# Patient Record
Sex: Female | Born: 1955 | Race: White | Hispanic: No | Marital: Single | Smoking: Never smoker
Health system: Southern US, Community
[De-identification: ages and names within clinical notes are randomized; demographics above are authoritative.]

## PROBLEM LIST (undated history)

## (undated) HISTORY — PX: BACK SURGERY: SHX140

---

## 1975-07-22 DIAGNOSIS — M419 Scoliosis, unspecified: Secondary | ICD-10-CM

## 1975-07-22 HISTORY — DX: Scoliosis, unspecified: M41.9

## 2014-05-30 ENCOUNTER — Other Ambulatory Visit: Payer: Self-pay | Admitting: Occupational Medicine

## 2014-05-30 ENCOUNTER — Ambulatory Visit: Payer: Self-pay

## 2014-05-30 DIAGNOSIS — R7612 Nonspecific reaction to cell mediated immunity measurement of gamma interferon antigen response without active tuberculosis: Secondary | ICD-10-CM

## 2018-03-19 ENCOUNTER — Encounter: Payer: Self-pay | Admitting: *Deleted

## 2018-03-19 ENCOUNTER — Emergency Department
Admission: EM | Admit: 2018-03-19 | Discharge: 2018-03-19 | Disposition: A | Discharge status: Home / Self Care | Payer: Self-pay | Source: Home / Self Care

## 2018-03-19 ENCOUNTER — Other Ambulatory Visit: Payer: Self-pay

## 2018-03-19 DIAGNOSIS — L02519 Cutaneous abscess of unspecified hand: Secondary | ICD-10-CM

## 2018-03-19 DIAGNOSIS — W5501XA Bitten by cat, initial encounter: Secondary | ICD-10-CM

## 2018-03-19 DIAGNOSIS — L03119 Cellulitis of unspecified part of limb: Secondary | ICD-10-CM

## 2018-03-19 MED ORDER — AMOXICILLIN-POT CLAVULANATE 875-125 MG PO TABS: 1.0000 | ORAL_TABLET | Freq: Two times a day (BID) | ORAL | 0 refills | Status: DC

## 2018-03-19 NOTE — Discharge Instructions (Addendum)
Continue the antibiotics for full 10 days.  Amoxicillin/clavulanate 875 mg twice daily.  If getting at all worse get rechecked.

## 2018-03-19 NOTE — ED Triage Notes (Signed)
Patient reports her cat bit her on her left hand while she was moving and attempting to put her in a crate. [facility] and given Augmentin. She reports it is improving but wants another opinion.

## 2018-03-19 NOTE — ED Provider Notes (Signed)
[facility]    CSN: [id number] Arrival date & time: 03/19/18  1241     History   Chief Complaint Chief Complaint  Patient presents with  . Animal Bite    HPI Alexa Jackson is a 62 y.o. female.   HPI 6 days ago the patient was moving and tried to put her cat in a carrier.  It bit her 3 places, one on the right index finger which is almost healed up, and to place on the left hand.  One is on the dorsum of the hand and one on the back of the proximal digit of the fourth finger.  2 days ago she went to a another urgent care they gave her a prescription for Augmentin.  The hand was swollen and red but no a sending streaks.  She says that the swelling and redness has improved.  Feels like it is definitely better than yesterday.  She just came in for another opinion. History reviewed. No pertinent past medical history.  There are no active problems to display for this patient.   Past Surgical History:  Procedure Laterality Date  . BACK SURGERY      OB History   None      Home Medications    Prior to Admission medications   Medication Sig Start Date End Date Taking? Authorizing Provider  amoxicillin-clavulanate (AUGMENTIN) 875-125 MG tablet Take 1 tablet by mouth 2 (two) times daily.   Yes [provider]  amoxicillin-clavulanate (AUGMENTIN) 875-125 MG tablet Take 1 tablet by mouth every 12 (twelve) hours. 03/19/18   Peyton NajjarHopper, Quinnetta Roepke H, MD    Family History History reviewed. No pertinent family history.  Social History Social History   Tobacco Use  . Smoking status: Never Smoker  . Smokeless tobacco: Never Used  Substance Use Topics  . Alcohol use: Not on file  . Drug use: Not on file     Allergies   Patient has no known allergies.   Review of Systems Review of Systems Otherwise unremarkable  Physical Exam Triage Vital Signs ED Triage Vitals [03/19/18 1306]  Enc Vitals Group     BP (!) 171/84     Pulse Rate 80     Resp 14     Temp  98.6 F (37 C)     Temp Source Oral     SpO2 99 %     Weight 173 lb (78.5 kg)     Height 5\' 3"  (1.6 m)     Head Circumference      Peak Flow      Pain Score 2     Pain Loc      Pain Edu?      Excl. in GC?    No data found.  Updated Vital Signs BP (!) 171/84 (BP Location: Right Arm)   Pulse 80   Temp 98.6 F (37 C) (Oral)   Resp 14   Ht 5\' 3"  (1.6 m)   Wt 78.5 kg   SpO2 99%   BMI 30.65 kg/m   Visual Acuity Right Eye Distance:   Left Eye Distance:   Bilateral Distance:    Right Eye Near:   Left Eye Near:    Bilateral Near:     Physical Exam Cellulitis on the dorsum of the left hand.  2 puncture wounds in the sites noted above.  The center of the cellulitis is at the fourth M CP area.  By her head is tender.  The erythema extends almost  to the wrist and to both sides of the hand.  No ascending streaks.  The puncture wound on the right hand is almost healed.  UC Treatments / Results  Labs (all labs ordered are listed, but only abnormal results are displayed) Labs Reviewed - No data to display  EKG None  Radiology No results found.  Procedures Procedures (including critical care time) None. Medications Ordered in UC Medications - No data to display  Initial Impression / Assessment and Plan / UC Course  I have reviewed the triage vital signs and the nursing notes.  Pertinent labs & imaging results that were available during my care of the patient were reviewed by me and considered in my medical decision making (see chart for details).     Cat bite with cellulitis, improving.  They only gave her 5 days of antibiotics I am recommending she stays on it for a full 10. Final Clinical Impressions(s) / UC Diagnoses   Final diagnoses:  Cat bite, initial encounter  Cellulitis and abscess of hand     Discharge Instructions     Continue the antibiotics for full 10 days.  Amoxicillin/clavulanate 875 mg twice daily.  If getting at all worse get  rechecked.    ED Prescriptions    Medication Sig Dispense Auth. Provider   amoxicillin-clavulanate (AUGMENTIN) 875-125 MG tablet Take 1 tablet by mouth every 12 (twelve) hours. 10 tablet Peyton Najjar, MD     Controlled Substance Prescriptions [place] Controlled Substance Registry consulted? No   Peyton Najjar, MD 03/19/18 1329

## 2018-03-27 ENCOUNTER — Encounter: Payer: Self-pay | Admitting: Emergency Medicine

## 2018-03-27 ENCOUNTER — Emergency Department
Admission: EM | Admit: 2018-03-27 | Discharge: 2018-03-27 | Disposition: A | Discharge status: Home / Self Care | Payer: Self-pay | Source: Home / Self Care | Attending: Family Medicine | Admitting: Family Medicine

## 2018-03-27 DIAGNOSIS — S61452D Open bite of left hand, subsequent encounter: Secondary | ICD-10-CM

## 2018-03-27 DIAGNOSIS — W5501XD Bitten by cat, subsequent encounter: Secondary | ICD-10-CM

## 2018-03-27 DIAGNOSIS — L089 Local infection of the skin and subcutaneous tissue, unspecified: Secondary | ICD-10-CM

## 2018-03-27 MED ORDER — MUPIROCIN 2 % EX OINT: TOPICAL_OINTMENT | CUTANEOUS | 0 refills | Status: DC

## 2018-03-27 MED ORDER — DOXYCYCLINE HYCLATE 100 MG PO CAPS: 100.0000 mg | ORAL_CAPSULE | Freq: Two times a day (BID) | ORAL | 0 refills | Status: DC

## 2018-03-27 NOTE — Discharge Instructions (Signed)
°  Please take the last dose of your Augmentin and start today's newly prescribed antibiotic to help with the remaining infected skin.  It is recommended you apply warm compresses 2-3 times daily to help with the infection. Please follow up in 3-4 days if not improving, sooner if worsening.

## 2018-03-27 NOTE — ED Triage Notes (Signed)
Pt c/o left hand pain from cat bite. She has 1 pill left of abx and wants to be sure it is healing properly.

## 2018-03-27 NOTE — ED Provider Notes (Signed)
[facility]    CSN: [id number] Arrival date & time: 03/27/18  1149     History   Chief Complaint Chief Complaint  Patient presents with  . Hand Pain    HPI Merl Guardino is a 62 y.o. female.   HPI Maysel Mccolm is a 62 y.o. female presenting to UC with c/o significantly improved pain, redness and swelling of Left hand after a cat bite over 10 days ago, but there is still a small area of redness, mild swelling and soreness. She is requested to be re-evaluated. She has 1 pill left of a 10 day course of Augmentin.  Denies GI symptoms. Denies fever or chills. She had her Tdap updated after she was bit and her cat is UTD on its rabies vaccine. Denies fever, chills, n/v/d.    History reviewed. No pertinent past medical history.  There are no active problems to display for this patient.   Past Surgical History:  Procedure Laterality Date  . BACK SURGERY      OB History   None      Home Medications    Prior to Admission medications   Medication Sig Start Date End Date Taking? Authorizing Provider  amoxicillin-clavulanate (AUGMENTIN) 875-125 MG tablet Take 1 tablet by mouth 2 (two) times daily.    [provider]  amoxicillin-clavulanate (AUGMENTIN) 875-125 MG tablet Take 1 tablet by mouth every 12 (twelve) hours. 03/19/18   Peyton Najjar, MD  doxycycline (VIBRAMYCIN) 100 MG capsule Take 1 capsule (100 mg total) by mouth 2 (two) times daily. One po bid x 7 days 03/27/18   Lurene Shadow, PA-C  mupirocin ointment Idelle Jo) 2 % Apply to wound 3 times daily for 5 days 03/27/18   Lurene Shadow, PA-C    Family History History reviewed. No pertinent family history.  Social History Social History   Tobacco Use  . Smoking status: Never Smoker  . Smokeless tobacco: Never Used  Substance Use Topics  . Alcohol use: Not on file  . Drug use: Not on file     Allergies   Patient has no known allergies.   Review of Systems Review of Systems    Constitutional: Negative for chills and fever.  Musculoskeletal: Positive for arthralgias and joint swelling.  Skin: Positive for color change and wound.  Neurological: Negative for weakness and numbness.     Physical Exam Triage Vital Signs ED Triage Vitals  Enc Vitals Group     BP 03/27/18 1209 (!) 143/85     Pulse Rate 03/27/18 1209 80     Resp --      Temp 03/27/18 1209 98.2 F (36.8 C)     Temp Source 03/27/18 1209 Oral     SpO2 03/27/18 1209 97 %     Weight 03/27/18 1210 173 lb (78.5 kg)     Height --      Head Circumference --      Peak Flow --      Pain Score 03/27/18 1210 4     Pain Loc --      Pain Edu? --      Excl. in GC? --    No data found.  Updated Vital Signs BP (!) 143/85 (BP Location: Right Arm)   Pulse 80   Temp 98.2 F (36.8 C) (Oral)   Wt 173 lb (78.5 kg)   SpO2 97%   BMI 30.65 kg/m   Visual Acuity Right Eye Distance:   Left Eye Distance:  Bilateral Distance:    Right Eye Near:   Left Eye Near:    Bilateral Near:     Physical Exam  Constitutional: She is oriented to person, place, and time. She appears well-developed and well-nourished. No distress.  HENT:  Head: Normocephalic and atraumatic.  Eyes: EOM are normal.  Neck: Normal range of motion.  Cardiovascular: Normal rate.  Pulmonary/Chest: Effort normal.  Musculoskeletal: Normal range of motion. She exhibits edema and tenderness.       Hands: Left hand: base over 4th finger/4th MCP- mild edema and tenderness. Full ROM all fingers.   Neurological: She is alert and oriented to person, place, and time.  Skin: Skin is warm and dry. She is not diaphoretic.  Left hand: base of 4th finger- 1cm area of erythema, mild edema with centralized fluctuance, mild tenderness. Skin in tact. No bleeding or discharge. No red streaking.   Psychiatric: She has a normal mood and affect. Her behavior is normal.  Nursing note and vitals reviewed.    UC Treatments / Results  Labs (all labs  ordered are listed, but only abnormal results are displayed) Labs Reviewed - No data to display  EKG None  Radiology No results found.  Procedures Incision and Drainage Date/Time: 03/27/2018 1:04 PM Performed by: Lurene Shadow, PA-C Authorized by: Lattie Haw, MD   Consent:    Consent obtained:  Verbal   Consent given by:  Patient   Risks discussed:  Bleeding, incomplete drainage, pain and infection   Alternatives discussed:  No treatment and delayed treatment Location:    Type:  Abscess   Size:  1cm   Location:  Upper extremity   Upper extremity location:  Finger   Finger location:  R ring finger Pre-procedure details:    Skin preparation:  Betadine Anesthesia (see MAR for exact dosages):    Anesthesia method:  Local infiltration   Local anesthetic:  Lidocaine 1% w/o epi Procedure type:    Complexity:  Simple Procedure details:    Incision types:  Stab incision   Incision depth:  Subcutaneous   Scalpel blade:  11   Drainage:  Bloody   Drainage amount:  Scant   Packing materials:  None Post-procedure details:    Patient tolerance of procedure:  Tolerated well, no immediate complications   (including critical care time)  Medications Ordered in UC Medications - No data to display  Initial Impression / Assessment and Plan / UC Course  I have reviewed the triage vital signs and the nursing notes.  Pertinent labs & imaging results that were available during my care of the patient were reviewed by me and considered in my medical decision making (see chart for details).     Wound appears to have healed significantly since initial visit as pt stated her hand was red and swollen all the way to her wrist.  Today, 1cm area of erythema with mild tenderness and fluctuance at base of Left 4th finger.  Discussed treating with round of Doxycycline and f/u or with Doxycycline and I&D.  Pt would like to not have to return if possible, small stab incision made to Left 4th  finger to allow for any drainage of pus. On scant amount of blood after procedure. Encouraged warm compresses, topical mupirocin and oral doxycycline F/u in 3-4 days if not improving, sooner if worsening.  Pt agreeable   Final Clinical Impressions(s) / UC Diagnoses   Final diagnoses:  Infected cat bite of hand, left, subsequent encounter  Discharge Instructions      Please take the last dose of your Augmentin and start today's newly prescribed antibiotic to help with the remaining infected skin.  It is recommended you apply warm compresses 2-3 times daily to help with the infection. Please follow up in 3-4 days if not improving, sooner if worsening.     ED Prescriptions    Medication Sig Dispense Auth. Provider   doxycycline (VIBRAMYCIN) 100 MG capsule Take 1 capsule (100 mg total) by mouth 2 (two) times daily. One po bid x 7 days 14 capsule Doroteo Glassman, Jayleene Glaeser O, PA-C   mupirocin ointment (BACTROBAN) 2 % Apply to wound 3 times daily for 5 days 22 g Lurene Shadow, [place]     Controlled Substance Prescriptions [place] Controlled Substance Registry consulted? Not Applicable   Rolla Plate 03/27/18 1308

## 2018-08-05 ENCOUNTER — Emergency Department
Admission: EM | Admit: 2018-08-05 | Discharge: 2018-08-05 | Disposition: A | Discharge status: Home / Self Care | Payer: Self-pay | Source: Home / Self Care

## 2018-08-05 ENCOUNTER — Other Ambulatory Visit: Payer: Self-pay

## 2018-08-05 ENCOUNTER — Encounter: Payer: Self-pay | Admitting: Family Medicine

## 2018-08-05 DIAGNOSIS — M549 Dorsalgia, unspecified: Secondary | ICD-10-CM

## 2018-08-05 DIAGNOSIS — G8929 Other chronic pain: Secondary | ICD-10-CM

## 2018-08-05 LAB — COMPLETE METABOLIC PANEL WITH GFR
AG Ratio: 1.4 (calc) (ref 1.0–2.5)
ALT: 18 U/L (ref 6–29)
AST: 25 U/L (ref 10–35)
Albumin: 4.3 g/dL (ref 3.6–5.1)
Alkaline phosphatase (APISO): 89 U/L (ref 33–130)
BUN: 14 mg/dL (ref 7–25)
CO2: 26 mmol/L (ref 20–32)
Calcium: 9.5 mg/dL (ref 8.6–10.4)
Chloride: 106 mmol/L (ref 98–110)
Creat: 0.61 mg/dL (ref 0.50–0.99)
GFR, Est African American: 113 mL/min/{1.73_m2} (ref >=60)
GFR, Est Non African American: 97 mL/min/{1.73_m2} (ref >=60)
Globulin: 3.1 g/dL (calc) (ref 1.9–3.7)
Glucose, Bld: 93 mg/dL (ref 65–99)
Potassium: 4.1 mmol/L (ref 3.5–5.3)
Sodium: 141 mmol/L (ref 135–146)
Total Bilirubin: 0.5 mg/dL (ref 0.2–1.2)
Total Protein: 7.4 g/dL (ref 6.1–8.1)

## 2018-08-05 MED ORDER — MAGNESIUM OXIDE 400 (241.3 MG) MG PO TABS: 400.0000 mg | ORAL_TABLET | Freq: Every day | ORAL | 1 refills | Status: AC

## 2018-08-05 NOTE — Discharge Instructions (Addendum)
Our initial strategy is to rule out a metabolic problem like low calcium  Also, taking magnesium supplement should help  Finally, seeing a physical therapist for core strengthening is important to take pressure off back muscles.

## 2018-08-05 NOTE — ED Provider Notes (Signed)
[facility]    CSN: [id number] Arrival date & time: 08/05/18  1131     History   Chief Complaint Chief Complaint  Patient presents with  . Back Pain    HPI Alexa Jackson is a 63 y.o. female.   [facility] patient  c/o chronic back spasms x 4/19'. She has not seen anyone for the pain and has not taken any OTC meds.   Patient has had paraspinal muscle spasms for a year.  She attributes this to having moved several times which is involved moving furniture and lifting.  Interestingly, she has had facial twitching that began at the same time and is continued to this day.  She was told this was because of stress.  Patient's not having any intestinal problems.  She takes no medication.  Patient has a history of Harrington rods done 42 years ago but has not had any problem with this until this past year.  She finds that she has to walk hunched over much of the time.  She is tried exercising more lately and that seems to be helping.     History reviewed. No pertinent past medical history.  There are no active problems to display for this patient.   Past Surgical History:  Procedure Laterality Date  . BACK SURGERY     for scoliosis    OB History   No obstetric history on file.      Home Medications    Prior to Admission medications   Medication Sig Start Date End Date Taking? Authorizing Provider  magnesium oxide (MAG-OX) 400 (241.3 Mg) MG tablet Take 1 tablet (400 mg total) by mouth daily for 10 days. 08/05/18 08/15/18  Elvina Sidle, MD    Family History History reviewed. No pertinent family history.  Social History Social History   Tobacco Use  . Smoking status: Never Smoker  . Smokeless tobacco: Never Used  Substance Use Topics  . Alcohol use: Never    Frequency: Never  . Drug use: Never     Allergies   Patient has no known allergies.   Review of Systems Review of Systems   Physical Exam Triage Vital Signs ED Triage Vitals   Enc Vitals Group     BP      Pulse      Resp      Temp      Temp src      SpO2      Weight      Height      Head Circumference      Peak Flow      Pain Score      Pain Loc      Pain Edu?      Excl. in GC?    No data found.  Updated Vital Signs BP (!) 172/106 (BP Location: Right Arm)   Pulse 79   Temp 97.9 F (36.6 C) (Oral)   Resp 18   Ht 5\' 3"  (1.6 m)   Wt 78.9 kg   SpO2 97%   BMI 30.82 kg/m    Physical Exam Vitals signs and nursing note reviewed.  Constitutional:      Appearance: Normal appearance.  HENT:     Head: Normocephalic and atraumatic.     Right Ear: External ear normal.     Left Ear: External ear normal.     Nose: Nose normal.     Mouth/Throat:     Pharynx: Oropharynx is clear.  Eyes:  Conjunctiva/sclera: Conjunctivae normal.     Pupils: Pupils are equal, round, and reactive to light.  Neck:     Musculoskeletal: Normal range of motion and neck supple.  Cardiovascular:     Rate and Rhythm: Normal rate and regular rhythm.     Heart sounds: Normal heart sounds.  Pulmonary:     Effort: Pulmonary effort is normal.     Breath sounds: Normal breath sounds.  Musculoskeletal: Normal range of motion.     Comments: Patient has well-healed surgical scars midline from her lumbar to upper thoracic region.  Patient has no localized tenderness.  She walks with a stooped posture.  Skin:    General: Skin is warm.  Neurological:     Mental Status: She is alert and oriented to person, place, and time.     Comments: Patient has persistent involuntary twitching on the right side of her face.  Psychiatric:        Mood and Affect: Mood normal.        Behavior: Behavior normal.        Thought Content: Thought content normal.        Judgment: Judgment normal.      UC Treatments / Results  Labs (all labs ordered are listed, but only abnormal results are displayed) Labs Reviewed  COMPLETE METABOLIC PANEL WITH GFR    EKG None  Radiology No results  found.  Procedures Procedures (including critical care time)  Medications Ordered in UC Medications - No data to display  Initial Impression / Assessment and Plan / UC Course  I have reviewed the triage vital signs and the nursing notes.  Pertinent labs & imaging results that were available during my care of the patient were reviewed by me and considered in my medical decision making (see chart for details).    Final Clinical Impressions(s) / UC Diagnoses   Final diagnoses:  Chronic midline back pain, unspecified back location     Discharge Instructions     Our initial strategy is to rule out a metabolic problem like low calcium  Also, taking magnesium supplement should help  Finally, seeing a physical therapist for core strengthening is important to take pressure off back muscles.    ED Prescriptions    Medication Sig Dispense Auth. Provider   magnesium oxide (MAG-OX) 400 (241.3 Mg) MG tablet Take 1 tablet (400 mg total) by mouth daily for 10 days. 10 tablet Elvina SidleLauenstein, Fredericka Bottcher, MD     Controlled Substance Prescriptions [place] Controlled Substance Registry consulted? Not Applicable   Elvina SidleLauenstein, Eban Weick, MD 08/05/18 1215

## 2018-08-05 NOTE — ED Triage Notes (Signed)
Pt c/o chronic back spasms x 4/19'. She has not seen anyone for the pain and has not taken any OTC meds.

## 2018-08-06 ENCOUNTER — Telehealth: Payer: Self-pay | Admitting: *Deleted

## 2018-08-06 NOTE — Telephone Encounter (Signed)
Contacted pt by phone. States she feels much better today. Is pursuing follow up treatment with the physical therapist that was recommended.

## 2018-08-09 ENCOUNTER — Telehealth (HOSPITAL_COMMUNITY): Payer: Self-pay | Admitting: Emergency Medicine

## 2018-08-09 NOTE — Telephone Encounter (Signed)
Patient contacted and made aware of all results, all questions answered.   

## 2019-01-18 ENCOUNTER — Other Ambulatory Visit: Payer: Self-pay

## 2019-01-18 ENCOUNTER — Emergency Department (HOSPITAL_BASED_OUTPATIENT_CLINIC_OR_DEPARTMENT_OTHER): Payer: Self-pay

## 2019-01-18 ENCOUNTER — Encounter (HOSPITAL_BASED_OUTPATIENT_CLINIC_OR_DEPARTMENT_OTHER): Payer: Self-pay

## 2019-01-18 ENCOUNTER — Emergency Department (HOSPITAL_BASED_OUTPATIENT_CLINIC_OR_DEPARTMENT_OTHER)
Admission: EM | Admit: 2019-01-18 | Discharge: 2019-01-18 | Disposition: A | Discharge status: Home / Self Care | Payer: Self-pay | Attending: Emergency Medicine | Admitting: Emergency Medicine

## 2019-01-18 DIAGNOSIS — R269 Unspecified abnormalities of gait and mobility: Secondary | ICD-10-CM

## 2019-01-18 DIAGNOSIS — R2689 Other abnormalities of gait and mobility: Secondary | ICD-10-CM | POA: Insufficient documentation

## 2019-01-18 NOTE — ED Provider Notes (Signed)
[facility] EMERGENCY DEPARTMENT Provider Note   CSN: [id number] Arrival date & time: 01/18/19  2595    History   Chief Complaint Chief Complaint  Patient presents with   Weakness    HPI Alexa Jackson is a 63 y.o. female.     HPI Patient has had symptoms for approximately a year.  She reports overall she feels good.  The problem is that when she starts walking she walks fairly short distance and then starts getting weakness that seems to develop around her lower back and her central abdomen.  Ultimately what happens is that she ends up being stooped forward and has to use a cane to help stabilize her weight.  She reports then she needs to rest and symptoms will improve.  She reports she does not really feel that there is any weakness in her legs per se but as she does walk she will start to feel some weakness in the upper legs in association with this "core weakness".  And has Harrington rods that were placed in the 1970s.  She did not have any problems with them.  She reports pain is not really an issue here.  She does not perceive any upper extremity weakness.  He denies any bowel or bladder dysfunction.  She has had a limited evaluation for this.  She did have screening lab work done in January when she had an urgent care visit with Dr. Joseph Art.  There was nothing remarkable on her electrolyte panel.  He was referred to physical therapy.  She reports she has been doing physical therapy and working on core strengthening and does feel that it has been beneficial.  She reports however it continues to be a problem because if she walks a distance today from the hospital to her car by the time she gets to her car she is quite stooped over.  She reports that she also has a twitching tic that occurs in the right eye.  She has been told is due to stress or anxiety.  Otherwise no visual complaints no double vision or decreased vision.  She reports she has been taking some magnesium and  calcium supplements and that seems to be helpful as well.  He denies any sensation of numbness or tingling into her arms or legs. History reviewed. No pertinent past medical history.  There are no active problems to display for this patient.   Past Surgical History:  Procedure Laterality Date   BACK SURGERY     for scoliosis     OB History   No obstetric history on file.      Home Medications    Prior to Admission medications   Not on File    Family History History reviewed. No pertinent family history.  Social History Social History   Tobacco Use   Smoking status: Never Smoker   Smokeless tobacco: Never Used  Substance Use Topics   Alcohol use: Never    Frequency: Never   Drug use: Never     Allergies   Patient has no known allergies.   Review of Systems Review of Systems 10 Systems reviewed and are negative for acute change except as noted in the HPI.   Physical Exam Updated Vital Signs BP 130/78 (BP Location: Right Arm)    Pulse 80    Temp 98 F (36.7 C) (Oral)    Resp 16    Ht 5\' 2"  (1.575 m)    Wt 79.4 kg    SpO2  99%    BMI 32.01 kg/m   Physical Exam Constitutional:      Comments: Patient is alert and clinically well in appearance.  She is well-nourished well-developed.  Did observe the patient ambulating into the room.  At that time she was using a cane.  Her gait is steady but slightly broad-based and she was using the cane for some balance.  She did not appear unstable.  HENT:     Head: Normocephalic and atraumatic.     Mouth/Throat:     Mouth: Mucous membranes are moist.     Pharynx: Oropharynx is clear.  Eyes:     Extraocular Movements: Extraocular movements intact.     Pupils: Pupils are equal, round, and reactive to light.     Comments: Normal consensual pupillary response.  Neck:     Musculoskeletal: Neck supple.  Cardiovascular:     Rate and Rhythm: Normal rate and regular rhythm.  Pulmonary:     Effort: Pulmonary effort is  normal.     Breath sounds: Normal breath sounds.  Abdominal:     General: There is no distension.     Palpations: Abdomen is soft.     Tenderness: There is no abdominal tenderness. There is no guarding.  Musculoskeletal:     Comments: Well-healed long midline fine scar.  No tenderness to palpation along the bony prominences of the back.  No peripheral edema.  Joints are without any effusions or redness.  Dorsalis pedis pulses are 2+ and symmetric.  Patient does not have any pain with range of motion.  I can deeply flex and extend both legs.  She can push against resistance without difficulty against my full weight.  He has good dorsiflexion and plantar flexion at the ankles.  Patient can go from sitting to standing.  Her gait does appear slightly broad-based but not unsteady.  After she walked back and forth several times to me in the bed she did report she needed to sit down because the muscles were starting to fatigue.  Neurological:     General: No focal deficit present.     Mental Status: She is oriented to person, place, and time.      ED Treatments / Results  Labs (all labs ordered are listed, but only abnormal results are displayed) Labs Reviewed - No data to display  EKG None  Radiology Ct Thoracic Spine Wo Contrast  Result Date: 01/18/2019 CLINICAL DATA:  Increasing weakness with standing and ambulation over the last year. History of remote scoliosis surgery. No recent fall. EXAM: CT THORACIC AND LUMBAR SPINE WITHOUT CONTRAST TECHNIQUE: Multidetector CT imaging of the thoracic and lumbar spine was performed without contrast. Multiplanar CT image reconstructions were also generated. COMPARISON:  Chest radiographs 05/30/2014. FINDINGS: CT THORACIC SPINE FINDINGS Alignment: There is conventional anatomy with 12 rib-bearing thoracic type vertebral bodies and 5 lumbar type vertebral bodies. There is a straightening of the usual thoracic kyphosis with an S-shaped scoliosis, convex to the  left at T2 and to the right at T8, measuring approximately 36 degrees. Vertebrae: Status post Harrington rod fixation of the thoracolumbar spine. On the right, there is a rod extending from T3-4 to T10 which appears intact. On left, the rod extends from T3 into the mid lumbar spine. This rod is also intact. There is no evidence of acute fracture. Posterolateral osseous fusion appears solid bilaterally. Paraspinal and other soft tissues: There are no paraspinal abnormalities. The lungs appear clear. Disc levels: The thoracic spinal canal appears  patent at all levels. No large disc herniation, spinal stenosis or nerve root encroachment identified. CT LUMBAR SPINE FINDINGS Segmentation: Normal.  The last open disc space is L5-S1. Alignment: There is a degenerative grade 1 anterolisthesis at L4-5 measuring 6 mm. The lateral alignment is otherwise normal. There is a convex left scoliosis centered at L2, measuring approximately 24 degrees. Vertebrae: As above, status post thoracolumbar Harrington rod fusion. The left-sided thoracic rod extends across midline into the posterior elements on the right in the lumbar region. This rod is intact and extends to the L3 level. There is no evidence of acute fracture or pars defect. The fusion appears solid at the operative levels. Paraspinal and other soft tissues: No significant paraspinal findings. There is mild aortoiliac atherosclerosis. Disc levels: The spinal canal is widely patent at and above the L2-3 disc space level. There is no spinal stenosis or nerve root encroachment. L3-4: Moderate adjacent segment disease with loss of disc height, annular disc bulging and vacuum phenomenon. There is moderate facet and ligamentous hypertrophy. These factors contribute to mild-to-moderate spinal stenosis with mild narrowing of the foramina bilaterally. L4-5: There is disc degeneration with loss of disc height, annular disc bulging and vacuum phenomenon. There is advanced facet and  ligamentous hypertrophy accounting for the grade 1 anterolisthesis. These factors contribute to mild-to-moderate spinal stenosis with asymmetric narrowing of the left lateral recess and right foramen. L5-S1: Disc height is maintained. There is asymmetric facet hypertrophy on the left without significant resulting spinal stenosis or nerve root encroachment. IMPRESSION: 1. Status post Harrington rod fixation of the thoracolumbar spine with intact hardware extending from T3 through L3. The fusion appears solid. 2. No acute osseous findings. 3. Residual thoracolumbar scoliosis with greatest component convex to the right in the lower thoracic region, measuring approximately 36 degrees. 4. Moderate spondylosis at L3-4 and L4-5 with disc degeneration, annular bulging and facet hypertrophy. Resulting mild-to-moderate spinal stenosis with mild narrowing of the lateral recesses and foramina as described. Electronically Signed   By: Carey BullocksWilliam  Veazey M.D.   On: 01/18/2019 13:22   Ct Lumbar Spine Wo Contrast  Result Date: 01/18/2019 CLINICAL DATA:  Increasing weakness with standing and ambulation over the last year. History of remote scoliosis surgery. No recent fall. EXAM: CT THORACIC AND LUMBAR SPINE WITHOUT CONTRAST TECHNIQUE: Multidetector CT imaging of the thoracic and lumbar spine was performed without contrast. Multiplanar CT image reconstructions were also generated. COMPARISON:  Chest radiographs 05/30/2014. FINDINGS: CT THORACIC SPINE FINDINGS Alignment: There is conventional anatomy with 12 rib-bearing thoracic type vertebral bodies and 5 lumbar type vertebral bodies. There is a straightening of the usual thoracic kyphosis with an S-shaped scoliosis, convex to the left at T2 and to the right at T8, measuring approximately 36 degrees. Vertebrae: Status post Harrington rod fixation of the thoracolumbar spine. On the right, there is a rod extending from T3-4 to T10 which appears intact. On left, the rod extends from  T3 into the mid lumbar spine. This rod is also intact. There is no evidence of acute fracture. Posterolateral osseous fusion appears solid bilaterally. Paraspinal and other soft tissues: There are no paraspinal abnormalities. The lungs appear clear. Disc levels: The thoracic spinal canal appears patent at all levels. No large disc herniation, spinal stenosis or nerve root encroachment identified. CT LUMBAR SPINE FINDINGS Segmentation: Normal.  The last open disc space is L5-S1. Alignment: There is a degenerative grade 1 anterolisthesis at L4-5 measuring 6 mm. The lateral alignment is otherwise normal. There is a  convex left scoliosis centered at L2, measuring approximately 24 degrees. Vertebrae: As above, status post thoracolumbar Harrington rod fusion. The left-sided thoracic rod extends across midline into the posterior elements on the right in the lumbar region. This rod is intact and extends to the L3 level. There is no evidence of acute fracture or pars defect. The fusion appears solid at the operative levels. Paraspinal and other soft tissues: No significant paraspinal findings. There is mild aortoiliac atherosclerosis. Disc levels: The spinal canal is widely patent at and above the L2-3 disc space level. There is no spinal stenosis or nerve root encroachment. L3-4: Moderate adjacent segment disease with loss of disc height, annular disc bulging and vacuum phenomenon. There is moderate facet and ligamentous hypertrophy. These factors contribute to mild-to-moderate spinal stenosis with mild narrowing of the foramina bilaterally. L4-5: There is disc degeneration with loss of disc height, annular disc bulging and vacuum phenomenon. There is advanced facet and ligamentous hypertrophy accounting for the grade 1 anterolisthesis. These factors contribute to mild-to-moderate spinal stenosis with asymmetric narrowing of the left lateral recess and right foramen. L5-S1: Disc height is maintained. There is asymmetric  facet hypertrophy on the left without significant resulting spinal stenosis or nerve root encroachment. IMPRESSION: 1. Status post Harrington rod fixation of the thoracolumbar spine with intact hardware extending from T3 through L3. The fusion appears solid. 2. No acute osseous findings. 3. Residual thoracolumbar scoliosis with greatest component convex to the right in the lower thoracic region, measuring approximately 36 degrees. 4. Moderate spondylosis at L3-4 and L4-5 with disc degeneration, annular bulging and facet hypertrophy. Resulting mild-to-moderate spinal stenosis with mild narrowing of the lateral recesses and foramina as described. Electronically Signed   By: Carey BullocksWilliam  Veazey M.D.   On: 01/18/2019 13:22    Procedures Procedures (including critical care time)  Medications Ordered in ED Medications - No data to display   Initial Impression / Assessment and Plan / ED Course  I have reviewed the triage vital signs and the nursing notes.  Pertinent labs & imaging results that were available during my care of the patient were reviewed by me and considered in my medical decision making (see chart for details).        Consult: Reviewed with Dr. Wynetta Emerycram.  We will proceed with CT thoracic and lumbar spine in the emergency department and patient can then follow-up with neurosurgery on outpatient basis.  Patient has had indolent course of increasing dysfunction with central weakness with walking.  She does have history of Harrington rods.  She is not having any pain or other constitutional symptoms.  He does not have any lower extremity weakness.  Patient is neurovascularly intact.  At this time plan will be for outpatient follow-up with neurosurgery.  Final Clinical Impressions(s) / ED Diagnoses   Final diagnoses:  Neurologic gait dysfunction    ED Discharge Orders    None       Arby BarrettePfeiffer, Teandre Hamre, MD 01/18/19 1343

## 2019-01-18 NOTE — Discharge Instructions (Signed)
1.  Make an appointment to follow-up with [facility] Neurosurgery. 2.  You may try purchasing a lumbar corset to see if this helps with your with walking.  This may be purchased at a medical supply store or online. 3.  Return to the emergency department if you have new weakness numbness tingling or bowel or bladder dysfunction.

## 2019-01-18 NOTE — ED Triage Notes (Addendum)
Pt states for past year has been experiencing weakness with standing/ambulation.  Was seen for same at [facility] urgent care 5 months ago, suggested physical therapy with some improvement.  Last therapy was last week. Does see some improvement.. Ambulates with steady gait.  Denies any recent falls, extremities equal strength.  Walks with a cane for safety.

## 2020-10-16 DIAGNOSIS — Z833 Family history of diabetes mellitus: Secondary | ICD-10-CM | POA: Diagnosis not present

## 2020-10-16 DIAGNOSIS — E663 Overweight: Secondary | ICD-10-CM | POA: Diagnosis not present

## 2020-10-16 DIAGNOSIS — R030 Elevated blood-pressure reading, without diagnosis of hypertension: Secondary | ICD-10-CM | POA: Diagnosis not present

## 2020-10-16 DIAGNOSIS — Z803 Family history of malignant neoplasm of breast: Secondary | ICD-10-CM | POA: Diagnosis not present

## 2020-11-06 DIAGNOSIS — Z0000 Encounter for general adult medical examination without abnormal findings: Secondary | ICD-10-CM | POA: Diagnosis not present

## 2020-11-06 DIAGNOSIS — Z131 Encounter for screening for diabetes mellitus: Secondary | ICD-10-CM | POA: Diagnosis not present

## 2020-11-06 DIAGNOSIS — Z136 Encounter for screening for cardiovascular disorders: Secondary | ICD-10-CM | POA: Diagnosis not present

## 2020-11-13 DIAGNOSIS — Z131 Encounter for screening for diabetes mellitus: Secondary | ICD-10-CM | POA: Diagnosis not present

## 2020-11-13 DIAGNOSIS — Z136 Encounter for screening for cardiovascular disorders: Secondary | ICD-10-CM | POA: Diagnosis not present

## 2020-11-27 ENCOUNTER — Other Ambulatory Visit: Payer: Self-pay

## 2020-11-27 ENCOUNTER — Ambulatory Visit: Payer: Medicare HMO | Admitting: Neurology

## 2020-11-27 ENCOUNTER — Encounter: Payer: Self-pay | Admitting: Neurology

## 2020-11-27 ENCOUNTER — Telehealth: Payer: Self-pay | Admitting: Neurology

## 2020-11-27 VITALS — BP 148/80 | HR 104 | Ht 63.0 in | Wt 141.0 lb

## 2020-11-27 DIAGNOSIS — G1220 Motor neuron disease, unspecified: Secondary | ICD-10-CM | POA: Diagnosis not present

## 2020-11-27 DIAGNOSIS — R269 Unspecified abnormalities of gait and mobility: Secondary | ICD-10-CM | POA: Diagnosis not present

## 2020-11-27 DIAGNOSIS — R531 Weakness: Secondary | ICD-10-CM | POA: Diagnosis not present

## 2020-11-27 DIAGNOSIS — M6250 Muscle wasting and atrophy, not elsewhere classified, unspecified site: Secondary | ICD-10-CM

## 2020-11-27 NOTE — Telephone Encounter (Signed)
[employer] medic[employer] order sent to [facility]. They will obtain the auth and reach out to the patient to schedule.  °

## 2020-11-27 NOTE — Progress Notes (Signed)
[facility]  PATIENT: Alexa Jackson DOB: August 02, 1955  REFERRING DOCTOR OR PCP: Orpah Melter, MD SOURCE: Patient, notes from primary care, notes from emergency room, imaging reports, MRI images personally reviewed.  _________________________________   HISTORICAL  CHIEF COMPLAINT:  Chief Complaint  Patient presents with  . Frequent falls    Rm 12 New Pt  "my legs give out from under me"    HISTORY OF PRESENT ILLNESS:  I had the pleasure of seeing your patient, Alexa Jackson, at [facility] for neurologic consultation regarding her frequent falls.  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   She had 3 falls last month, including one on stairs.    She began to notice issues with core strength and gait around Spring 2020   At the time, she was caring for hr aunt who was heavy.   After she passed, she had to clean out her aunt's house which toik dys of labor.   She began to notice weakness in her core, mostly lower back, and sometimes legs after she walked more.    She had an emergency room visit 01/18/2019 due to weakness in her lower back and core, and gait difficulties.    She started to use a cane around February 2021 and a walker around June 2021.   She feels more stable with the walkr.   She saw Dr. Saintclair Halsted in 2020 and she reports he felt surgery would not be necessary at that time.  He recommended PT.  She is still doing PT and Pilates.  Currently, she notes no weakness, numbness or pain when she is sitting.  However, she notes some twitching in her quads.   She also gets muscle spasms in her quads and rarely in the upper arms.  Upon standing, she starts with good posture most times but sometimes leans forward.   Sometimes, she has trouble getting out of a chair.   When she walks, she usually leans  forward but can stand up straight if distances are shorter.   As she walks a leg or both legs may give out.    She is having more difficulty climbing 6 steps at her home.   She does better going downstairs.      She denies any major change in her bladder but last fall, she had urgency and frequency but no incontinence.     On exam, I noticed atrophy in intrinsic hand muscles.   She also reports arms feel weaker x 4 months.   She has had some twitching in the fingers.   She also has twitching in right face x several months.  She has had weight loss of 35 pounds over last 2 years.       IMAGING: I reviewed the MRI of the lumbar spine from 02/10/2019.  There are Harrington rods terminating at L3.  At L2-L3 there are degenerative changes with facet hypertrophy but no nerve root compression or spinal stenosis.  At L3-L4, there is severe facet hypertrophy with a joint effusion on the left and mild spinal stenosis.  There is mild to moderate right lateral recess stenosis and mild bilateral foraminal narrowing but does not appear to be nerve root compression.  At L4-L5, there is mild spinal stenosis due to facet hypertrophy, ligamentum flavum hypertrophy, 2 mm of anterolisthesis and disc bulging.  There is moderate bilateral foraminal narrowing and mild to moderate left lateral recess stenosis but no definite nerve root compression.  At L5-S1 there are degenerative changes but no spinal stenosis or nerve root compression.  Also reviewed the CT scan of the thoracic and lumbar spine from 01/18/2019.  It showed the Harrington rods from T3-L3 with solid fusion and residual thoracolumbar scoliosis convex to the right  CMP 08/05/2018 was normal.  REVIEW OF SYSTEMS: Constitutional: No fevers, chills, sweats, or change in appetite Eyes: No visual changes, double vision, eye pain Ear, nose and throat: No hearing loss, ear pain, nasal congestion, sore throat Cardiovascular: No chest pain, palpitations Respiratory: No  shortness of breath at rest or with exertion.   No wheezes GastrointestinaI: No nausea, vomiting, diarrhea, abdominal pain, fecal incontinence Genitourinary: No dysuria, urinary retention or frequency.  No nocturia. Musculoskeletal: No neck pain, back pain Integumentary: No rash, pruritus, skin lesions Neurological: as above Psychiatric: No depression at this time.  No anxiety Endocrine: No palpitations, diaphoresis, change in appetite, change in weigh or increased thirst Hematologic/Lymphatic: No anemia, purpura, petechiae. Allergic/Immunologic: No itchy/runny eyes, nasal congestion, recent allergic reactions, rashes  ALLERGIES: No Known Allergies  HOME MEDICATIONS: No current outpatient medications on file.  PAST MEDICAL HISTORY: Past Medical History:  Diagnosis Date  . Scoliosis 1977    PAST SURGICAL HISTORY: Past Surgical History:  Procedure Laterality Date  . BACK SURGERY     for scoliosis    FAMILY HISTORY: Family History  Problem Relation Age of Onset  . Breast cancer Mother   . ALS Father     SOCIAL HISTORY:  Social History   Socioeconomic History  . Marital status: Single    Spouse name: Not on file  . Number of children: 0  . Years of education: Not on file  . 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 Smoking status: Never Smoker  . Smokeless tobacco: Never Used  Vaping Use  . Vaping Use: Never used  Substance and Sexual Activity  . Alcohol use: Never  . Drug use: Never  . Sexual activity: Not on file  Other Topics Concern  . Not on file  Social History Narrative   lives alone   Social Determinants of Health   Financial Resource Strain: Not on file  Food Insecurity: Not on file  Transportation Needs: Not on file  Physical Activity: Not on file  Stress: Not on file  Social Connections: Not on file  Intimate Partner Violence: Not on  file     PHYSICAL EXAM  Vitals:   11/27/20 0759  BP: (!) 148/80  Pulse: (!) 104  Weight: 141 lb (64 kg)  Height: $Remove'5\' 3"'teElgII$  (1.6 m)    Body mass index is 24.98 kg/m.   General: The patient is well-developed and well-nourished and in no acute distress  HEENT:  Head is [place]/AT.  Sclera are anicteric.  Funduscopic exam shows normal optic discs and retinal vessels.  Neck: No carotid bruits are noted.  The neck is nontender.  Cardiovascular: The heart has a regular rate and rhythm with a normal S1 and S2. There were no murmurs, gallops or rubs.    Skin: Extremities are without rash or  edema.  Musculoskeletal:  Back is nontender  Neurologic Exam  Mental status: The patient is alert and oriented x 3 at the time of the examination. The patient has apparent  normal recent and remote memory, with an apparently normal attention span and concentration ability.   Speech is normal.  Cranial nerves: Extraocular movements are full. Pupils are equal, round, and reactive to light and accomodation.   Facial symmetry is present. There is good facial sensation to soft touch bilaterally.Facial strength is normal.  Trapezius and sternocleidomastoid strength is normal.  Some twitching in her right face.   No dysarthria is noted.  The tongue is midline, and the patient has symmetric elevation of the soft palate. No obvious hearing deficits are noted.  Motor:  She has atrophy in intrinsic hand muscles, normal muscle tone.     Strength (R/L) is Deltoid 4-/4, triceps 4-/4-, biceps 4/4, finger extensors, 2+/3; finger flexors 4+/4+, intrinsic hand muscles 3/3 Hip flexors  3/3 ; Knee extensors 4/4, knee flexors 4-/4-; Ankle extensors 2/2 ankle flexors 4-/4-; toe extensors 2-/2-  Sensory: Sensory testing is intact to pinprick, soft touch and vibration sensation in all 4 extremities.  Coordination: Cerebellar testing reveals good finger-nose-finger and heel-to-shin bilaterally.  Gait and station: She needs to  use her arms to stand up from the chair.  She can stand up without support.  Romberg is negative.  She needs a walker.  She has bilateral foot drops.  She stoops forward.  Reflexes: Deep tendon reflexes are symmetric and normal bilaterally.   Plantar responses are flexor.    DIAGNOSTIC DATA (LABS, IMAGING, TESTING) - I reviewed patient records, labs, notes, testing and imaging myself where available.  No results found for: WBC, HGB, HCT, MCV, PLT    Component Value Date/Time   NA 141 08/05/2018 1220   K 4.1 08/05/2018 1220   CL 106 08/05/2018 1220   CO2 26 08/05/2018 1220   GLUCOSE 93 08/05/2018 1220   BUN 14 08/05/2018 1220   CREATININE 0.61 08/05/2018 1220   CALCIUM 9.5 08/05/2018 1220   PROT 7.4 08/05/2018 1220   AST 25 08/05/2018 1220   ALT 18 08/05/2018 1220   BILITOT 0.5 08/05/2018 1220   GFRNONAA 97 08/05/2018 1220   GFRAA 113 08/05/2018 1220       ASSESSMENT AND PLAN  Weakness - Plan: CK, GM1 IgG Autoantibodies, NCV with EMG(electromyography), Thyroid Panel With TSH, Multiple Myeloma Panel (SPEP&IFE w/QIG), Vitamin B12  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 Mauch is a 65 year old woman who has had progressive weakness in the legs for the past 2 to 3 years and in the arms for the past 4 months.  She also has difficulty with her gait.  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  On exam, she had atrophy in the intrinsic hand muscles and significant weakness in both arms and legs.  Additionally she had twitching that I noted in the right face and she  has noted twitching elsewhere.  I am concerned about the possibility of several processes.  She could have a cervical spine process and we need to rule out an extrinsic or intrinsic myelopathy with MRI of the cervical spine.  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  Multifocal motor  neuropathy can mimic ALS.  I will check CK, thyroid function, anti-GM 1 IgG antibodies, SPEP/IEF and vitamin B12.  Additionally we will check an NCV/EMG to further characterize her disorder.  Based on the results, additional blood work or other test may be required.  I will see her when she returns for the EMG/NCV but she should call sooner if she has significant new or worrisome neurologic symptoms.  Thank you for asking me to see Ms. Garlitz.  Please let me know if I can be of further assistance with her or other patients in the future.   Dianca Owensby A. Felecia Shelling, MD, [facility] 5/90/1724, 19:54 AM Certified in Neurology, Clinical Neurophysiology, Sleep Medicine and Neuroimaging  [facility] [address], [place], [place] [address] [phone]

## 2020-12-01 ENCOUNTER — Ambulatory Visit
Admission: RE | Admit: 2020-12-01 | Discharge: 2020-12-01 | Disposition: A | Discharge status: Home / Self Care | Payer: Medicare HMO | Source: Ambulatory Visit | Attending: Neurology | Admitting: Neurology

## 2020-12-01 ENCOUNTER — Other Ambulatory Visit: Payer: Self-pay

## 2020-12-01 DIAGNOSIS — G1220 Motor neuron disease, unspecified: Secondary | ICD-10-CM

## 2020-12-03 ENCOUNTER — Telehealth: Payer: Self-pay | Admitting: *Deleted

## 2020-12-03 NOTE — Telephone Encounter (Signed)
Called and spoke with pt about results per Dr. Bonnita Hollow note. Pt verbalized understanding. Reminded her EMG/NCS 12/11/20 at 8:45am.

## 2020-12-03 NOTE — Telephone Encounter (Signed)
-----   Message from Asa Lente, MD sent at 12/02/2020  5:25 PM EDT ----- Please let her know the MRI of the cervical spine just showed mild disc and arthritic degenerative changes but no nerve root compression.  I will see her next week for the nerve conduction study/EMG

## 2020-12-04 LAB — MULTIPLE MYELOMA PANEL, SERUM
Albumin SerPl Elph-Mcnc: 4 g/dL (ref 2.9–4.4)
Albumin/Glob SerPl: 1.3 (ref 0.7–1.7)
Alpha 1: 0.2 g/dL (ref 0.0–0.4)
Alpha2 Glob SerPl Elph-Mcnc: 0.8 g/dL (ref 0.4–1.0)
B-Globulin SerPl Elph-Mcnc: 0.9 g/dL (ref 0.7–1.3)
Gamma Glob SerPl Elph-Mcnc: 1.2 g/dL (ref 0.4–1.8)
Globulin, Total: 3.1 g/dL (ref 2.2–3.9)
IgA/Immunoglobulin A, Serum: 142 mg/dL (ref 87–352)
IgG (Immunoglobin G), Serum: 1243 mg/dL (ref 586–1602)
IgM (Immunoglobulin M), Srm: 47 mg/dL (ref 26–217)
Total Protein: 7.1 g/dL (ref 6.0–8.5)

## 2020-12-04 LAB — INTERPRETATION

## 2020-12-04 LAB — THYROID PANEL WITH TSH
Free Thyroxine Index: 2.5 (ref 1.2–4.9)
T3 Uptake Ratio: 29 % (ref 24–39)
T4, Total: 8.7 ug/dL (ref 4.5–12.0)
TSH: 2.41 u[IU]/mL (ref 0.450–4.500)

## 2020-12-04 LAB — VITAMIN B12: Vitamin B-12: 408 pg/mL (ref 232–1245)

## 2020-12-04 LAB — CK: Total CK: 411 U/L — ABNORMAL HIGH (ref 32–182)

## 2020-12-04 LAB — GM1 IGG AUTOANTIBODIES: GM1 IgG Autoantibodies: <20 % (ref 0–30)

## 2020-12-11 ENCOUNTER — Encounter: Payer: Medicare HMO | Admitting: Neurology

## 2020-12-11 ENCOUNTER — Ambulatory Visit (INDEPENDENT_AMBULATORY_CARE_PROVIDER_SITE_OTHER): Payer: Medicare HMO | Admitting: Neurology

## 2020-12-11 ENCOUNTER — Other Ambulatory Visit: Payer: Self-pay

## 2020-12-11 DIAGNOSIS — R531 Weakness: Secondary | ICD-10-CM | POA: Diagnosis not present

## 2020-12-11 DIAGNOSIS — G1221 Amyotrophic lateral sclerosis: Secondary | ICD-10-CM

## 2020-12-11 DIAGNOSIS — Z0289 Encounter for other administrative examinations: Secondary | ICD-10-CM

## 2020-12-11 DIAGNOSIS — M6250 Muscle wasting and atrophy, not elsewhere classified, unspecified site: Secondary | ICD-10-CM

## 2020-12-11 DIAGNOSIS — M6259 Muscle wasting and atrophy, not elsewhere classified, multiple sites: Secondary | ICD-10-CM

## 2020-12-11 MED ORDER — RILUZOLE 50 MG PO TABS: 50.0000 mg | ORAL_TABLET | Freq: Two times a day (BID) | ORAL | 3 refills | Status: DC

## 2020-12-11 NOTE — Progress Notes (Signed)
Full Name: Alexa Jackson Gender: Female MRN #: [id number] Date of Birth: [birth year]    Visit Date: 12/11/2020 08:30 Age: 65 Years Examining Physician: Despina Arias, MD  Referring Physician: Despina Arias, MD Height: 5 feet 3 inch Weight: 141lbs      History: Alexa Jackson is a 65 year old woman who notes weakness predominantly in the legs and hands that has progressed over the past year.  She has noted some muscle fasciculations.  On exam, she has atrophy in the intrinsic hand muscles and to a lesser extent the deltoid and calves.  Strength was mildly to moderately reduced in all of the muscles tested.  Muscle tone is normal.  She cannot rise from a chair without using her arms.  Sensation was normal.  Reflexes were fairly normal.  She has lost almost 30 pounds in the past year without dieting.  Nerve conduction studies: The right peroneal and right median motor responses were absent.  The right tibial and right ulnar motor responses had reduced amplitudes with normal distal latencies and conduction velocities.  F-wave latencies were normal.  The sural, superficial peroneal and ulnar sensory responses had normal peak latencies and amplitudes.  The right median sensory response had a delayed peak latency but normal amplitude.  Electromyography: Needle EMG of selected muscles of the right arm, right leg, and limited EMG of the right face and left arm was performed.  In the arms or legs, every muscle tested had abnormal spontaneous activity and polyphasic motor units with neuropathic recruitment patterns.  The right mentalis muscle was normal.  The right genioglossus muscle had mild chronic elevation but no abnormal spontaneous activity.  Impression: This NCV/EMG study shows the following: 1.   Mixed acute and chronic denervation in all of the muscles tested in the right arm, right leg and left arm.  Additionally, the motor nerve responses were reduced in amplitude or absent while  sensory responses were normal or relatively preserved.  This pattern is most concerning for a motor neuron disorder such as amyotrophic lateral sclerosis. 2.   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  Mazal Ebey A. Epimenio Foot, MD, PhD, FAAN Certified in Neurology, Clinical Neurophysiology, Sleep Medicine, Pain Medicine and Neuroimaging Director, [facility]  [facility] [address], [address] [place], [place] [address] [phone]      Verbal informed consent was obtained from the patient, patient was informed of potential risk of procedure, including bruising, bleeding, hematoma formation, infection, muscle weakness, muscle pain, numbness, among others.        MNC    Nerve / Sites Muscle Latency Ref. Amplitude Ref. Rel Amp Segments Distance Velocity Ref. Area    ms ms mV mV %  cm m/s m/s mVms  R Median - APB     Wrist APB NR ?4.4 NR ?4.0 NR Wrist - APB 7   NR     Upper arm APB NR  NR  NR Upper arm - Wrist 21 NR ?49 NR  R Ulnar - ADM     Wrist ADM 3.2 ?3.3 0.9 ?6.0 100 Wrist - ADM 7   3.1     B.Elbow ADM 7.0  2.4  277 B.Elbow - Wrist 19 51 ?49 6.6     A.Elbow ADM 8.6  2.7  116 A.Elbow - B.Elbow 10 62 ?49 7.8  R Peroneal - EDB     Ankle EDB NR ?6.5 NR ?2.0 NR Ankle -  EDB 9   NR     Fib head EDB NR  NR  NR Fib head - Ankle 26 NR ?44 NR     Pop fossa EDB NR  NR  NR Pop fossa - Fib head 10 NR ?44 NR         Pop fossa - Ankle      R Tibial - AH     Ankle AH 5.3 ?5.8 2.1 ?4.0 100 Ankle - AH 9   8.8     Pop fossa AH 13.6  0.7  33.6 Pop fossa - Ankle 34 41 ?41 1.9             SNC    Nerve / Sites Rec. Site Peak Lat Ref.  Amp Ref. Segments Distance    ms ms V V  cm  R Sural - Ankle (Calf)     Calf Ankle 3.6 ?4.4 8 ?6 Calf - Ankle 14  R Superficial peroneal - Ankle     Lat leg Ankle 3.7 ?4.4 6 ?6 Lat leg - Ankle 14  R Median - Orthodromic (Dig II, Mid palm)     Dig II Wrist 4.3 ?3.4 10 ?10 Dig II -  Wrist 13  R Ulnar - Orthodromic, (Dig V, Mid palm)     Dig V Wrist 3.1 ?3.1 12 ?5 Dig V - Wrist 60             F  Wave    Nerve F Lat Ref.   ms ms  R Tibial - AH 52.6 ?56.0  R Ulnar - ADM 30.0 ?32.0         EMG Summary Table    Spontaneous MUAP Recruitment  Muscle IA Fib PSW Fasc Other Amp Dur. Poly Pattern  R. Deltoid Normal 2+ 3+ None _______ Normal Increased 4+ Discrete  R. Triceps brachii Normal 2+ 1+ None _______ Increased Increased 2+ Reduced  R. Biceps brachii Normal 2+ 3+ None CRDs Increased Increased 3+ Discrete  R. Extensor digitorum communis Normal 2+ 3+ None _______ Increased Increased 4+ Single  R. First dorsal interosseous Normal 2+ 2+ None _______ Normal Increased 4+ Single  R. Abductor pollicis brevis Normal 2+ 1+ None _______ Normal Increased 4+ Single  R. Vastus medialis Normal 2+ 2+ None _______ Normal Increased 2+ Discrete  R. Peroneus longus Normal 2+ 2+ None _______ Increased Increased 4+ Discrete  R. Tibialis anterior Normal 1+ 2+ None _______ Normal Increased 4+ Discrete  R. Gastrocnemius (Medial head) Normal 2+ 2+ None _______ Increased Increased 4+ Discrete  R. Abductor hallucis Normal 1+ 2+ None _______ Normal Increased 3+ Discrete  R. Mentalis Normal None None None _______ Normal Normal Normal Normal  R. Genioglossus Normal None None None _______ Normal Normal 1+ Reduced  L. Deltoid Normal 2+ 2+ None _______ Normal Increased 4+ Discrete            [facility]  PATIENT: Alexa Jackson DOB: Jul 06, 1956  REFERRING DOCTOR OR PCP: Joycelyn Rua, MD SOURCE: Patient, notes from primary care, notes from emergency room, imaging reports, MRI images personally reviewed.  _________________________________   HISTORICAL  CHIEF COMPLAINT:  No chief complaint on file.   HISTORY OF PRESENT ILLNESS:  Alexa Jackson is a 65 y.o. woman with progressive weakness.  Update 12/11/2020 At last visi 11/27/2020 t, I was concerned about the  possibility of a motor neuron disorder due to her progressive weakness, progressive atrophy, weight loss and muscle twitching.  At the last visit, CK, GM1  IgG autoantibody, thyroid panel, SPEP/IEF and vitamin B12 were normal.  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    She began to notice issues with core strength and gait around Spring 2020 she has been taking care of her elderly aunt who passed and she noted difficulty with lifting her hand and cleaning the house after she died.  She had an emergency room visit 01/18/2019 due to weakness in her lower back and core, and gait difficulties.    She started to use a cane around February 2021 and a walker around June 2021.   She feels more stable with the walkr.   She saw Dr. Wynetta Emeryram in 2020 and she reports he felt surgery would not be necessary at that time.  He recommended PT.  She is still doing PT and Pilates.  She is noting weakness more in the legs and the hands.  She has difficulty climbing 6 steps in her home.  She has some urinary urgency and frequency but no incontinence.  She notes no new symptoms since I saw her earlier this month.  NCV/EMG 12/11/2020 shows mixed acute and chronic denervation in all of the muscles tested in the right arm, right leg and left arm.  Additionally, the motor nerve responses were reduced in amplitude or absent while sensory responses were normal or relatively preserved.  This pattern is most concerning for a motor neuron disorder such as amyotrophic lateral sclerosis.      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   IMAGING: MRI of the lumbar spine from 02/10/2019.  There are Harrington rods terminating at L3.  At L2-L3 there are degenerative changes with facet hypertrophy but no nerve root compression or spinal stenosis.  At L3-L4, there is severe facet hypertrophy with a joint effusion on the left and mild spinal stenosis.  There is mild to moderate right lateral recess  stenosis and mild bilateral foraminal narrowing but does not appear to be nerve root compression.  At L4-L5, there is mild spinal stenosis due to facet hypertrophy, ligamentum flavum hypertrophy, 2 mm of anterolisthesis and disc bulging.  There is moderate bilateral foraminal narrowing and mild to moderate left lateral recess stenosis but no definite nerve root compression.  At L5-S1 there are degenerative changes but no spinal stenosis or nerve root compression.  MRI cervical spine 12/01/2020 shows a normal spinal cord.  There is mild multilevel degenerative changes but no spinal stenosis or nerve root compression.  Also reviewed the CT scan of the thoracic and lumbar spine from 01/18/2019.  It showed the Harrington rods from T3-L3 with solid fusion and residual thoracolumbar scoliosis convex to the right    REVIEW OF SYSTEMS: Constitutional: No fevers, chills, sweats, or change in appetite Eyes: No visual changes, double vision, eye pain Ear, nose and throat: No hearing loss, ear pain, nasal congestion, sore throat Cardiovascular: No chest pain, palpitations Respiratory: No shortness of breath at rest or with exertion.   No wheezes GastrointestinaI: No nausea, vomiting, diarrhea, abdominal pain, fecal incontinence Genitourinary: No dysuria, urinary retention or frequency.  No nocturia. Musculoskeletal: No neck pain, back pain Integumentary: No rash, pruritus, skin lesions Neurological: as above Psychiatric: No depression at this time.  No anxiety Endocrine: No palpitations, diaphoresis, change in appetite, change in weigh or increased thirst Hematologic/Lymphatic: No anemia, purpura, petechiae. Allergic/Immunologic: No itchy/runny eyes, nasal congestion, recent allergic reactions, rashes  ALLERGIES: No Known Allergies  HOME MEDICATIONS:  Current Outpatient Medications:  .  riluzole (RILUTEK) 50 MG tablet, Take 1 tablet (50 mg total) by mouth every 12 (twelve) hours., Disp: 180  tablet, Rfl: 3  PAST MEDICAL HISTORY: Past Medical History:  Diagnosis Date  . Scoliosis 1977    PAST SURGICAL HISTORY: Past Surgical History:  Procedure Laterality Date  . BACK SURGERY     for scoliosis    FAMILY HISTORY: Family History  Problem Relation Age of Onset  . Breast cancer Mother   . ALS Father     SOCIAL HISTORY:  Social History   Socioeconomic History  . Marital status: Single    Spouse name: Not on file  . Number of children: 0  . Years of education: Not on file  . 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 Smoking status: Never Smoker  . Smokeless tobacco: Never Used  Vaping Use  . Vaping Use: Never used  Substance and Sexual Activity  . Alcohol use: Never  . Drug use: Never  . Sexual activity: Not on file  Other Topics Concern  . Not on file  Social History Narrative   lives alone   Social Determinants of Health   Financial Resource Strain: Not on file  Food Insecurity: Not on file  Transportation Needs: Not on file  Physical Activity: Not on file  Stress: Not on file  Social Connections: Not on file  Intimate Partner Violence: Not on file     PHYSICAL EXAM  There were no vitals filed for this visit.  There is no height or weight on file to calculate BMI.   General: The patient is well-developed and well-nourished and in no acute distress  HEENT:  Head is [place]/AT.  Sclera are anicteric.    Neck: Good range of motion.  The neck is nontender.  Skin: Extremities are without rash or  edema.  Neurologic Exam  Mental status: The patient is alert and oriented x 3 at the time of the examination. The patient has apparent normal recent and remote memory, with an apparently normal attention span and concentration ability.   Speech is normal.  Cranial nerves: Extraocular movements are full.  Facial strength and sensation was normal.   No muscle twitching was noted today.  The tongue is midline, and the patient has symmetric elevation of the soft palate.  There are no fasciculations noted in the tongue no obvious hearing deficits are noted.  Motor:  She has atrophy in intrinsic hand muscles, normal muscle tone.     Strength (R/L) is Deltoid 4-/4, triceps 4-/4-, biceps 4/4, finger extensors, 2+/3; finger flexors 4+/4+, intrinsic hand muscles 3/3 Hip flexors  3/3 ; Knee extensors 4/4, knee flexors 4-/4-; Ankle extensors 2/2 ankle flexors 4-/4-; toe extensors 2-/2-  Sensory: Sensory testing is intact to pinprick, soft touch and vibration sensation in all 4 extremities.  Coordination: Cerebellar testing reveals good finger-nose-finger and heel-to-shin bilaterally.  Gait and station: She needs to use her arms to stand up from the chair.  She can stand up without support.  Romberg is negative.  She needs a walker.  She has bilateral foot drops.  She stoops forward.  Reflexes: Deep tendon reflexes are symmetric and normal bilaterally.   Plantar responses are flexor.    DIAGNOSTIC DATA (LABS, IMAGING, TESTING) - I reviewed patient records, labs, notes, testing and imaging myself where available.  No results found for: WBC, HGB, HCT, MCV, PLT  Component Value Date/Time   NA 141 08/05/2018 1220   K 4.1 08/05/2018 1220   CL 106 08/05/2018 1220   CO2 26 08/05/2018 1220   GLUCOSE 93 08/05/2018 1220   BUN 14 08/05/2018 1220   CREATININE 0.61 08/05/2018 1220   CALCIUM 9.5 08/05/2018 1220   PROT 7.1 11/27/2020 0851   AST 25 08/05/2018 1220   ALT 18 08/05/2018 1220   BILITOT 0.5 08/05/2018 1220   GFRNONAA 97 08/05/2018 1220   GFRAA 113 08/05/2018 1220       ASSESSMENT AND PLAN  Amyotrophic lateral sclerosis (ALS) (HCC)  Weakness - Plan: NCV with EMG(electromyography)  Muscular atrophy, unspecified site - Plan: NCV with EMG(electromyography)  1.   We had a very long discussion today about amyotrophic lateral  sclerosis as well as her most likely diagnosis.  Fortunately, she has no dysphagia or shortness of breath which are associated with a worse prognosis.  We discussed that 2 FDA approved medications have been found to slow the progression of ALS, riluzole and edaravone.  It is possible that the combination of these medications will help even more than either medication by itself.  I will start her on riluzole 50 mg p.o. twice daily.  We also had her sign a service request form for Radicava (edaravone) and I will see if we can get this approved for her as well.   2.   We also discussed prognosis.  It is possible that she will have more medical needs as her disease progresses and I asked her to consider as referring her to an ALS center as a could offer a more multidisciplinary approach.  She will give this more thought. 3.   Meantime, she is advised to stay active and to continue the Pilates exercises that she has found helpful 4.   She will return to see me in 2 to 3 months or sooner for new or worsening neurologic symptoms.  50-minute office visit with the majority of the time spent face-to-face for history and physical, discussion/counseling and decision-making.  Additional time with record review and documentation.   Alexa Jackson A. Epimenio Foot, MD, [facility] 12/11/2020, 5:29 PM Certified in Neurology, Clinical Neurophysiology, Sleep Medicine and Neuroimaging  [facility] [address] [place], [place] [address] [phone]

## 2020-12-12 ENCOUNTER — Telehealth: Payer: Self-pay | Admitting: *Deleted

## 2020-12-12 NOTE — Telephone Encounter (Signed)
Faxed completed/signed Radicava start form to [employer] at [phone]. Received fax confirmation. Dose:  Starter: Administer 105mg  (32ml) orally once daily for 14 consecutive days, followed by 14-day drug free period, qty: 54ml, no refills. Subsequent dose: Administer 105mg  (26ml) orally for 10 days out of 14 days, followed by 14-day drug free periods. Qty 18ml, refills: 11

## 2020-12-13 ENCOUNTER — Telehealth: Payer: Self-pay | Admitting: Neurology

## 2020-12-13 MED ORDER — RILUZOLE 50 MG PO TABS: 50.0000 mg | ORAL_TABLET | Freq: Two times a day (BID) | ORAL | 2 refills | Status: AC

## 2020-12-13 NOTE — Telephone Encounter (Signed)
Called pt back. Advised per Dr. Epimenio Foot she can continue both PT/PIlates as much as she can tolerate. Good to stay active. Pt verbalized understanding and appreciation.

## 2020-12-13 NOTE — Telephone Encounter (Addendum)
Called and spoke with patient. She wants future refills of RILUTEK  to go to [facility]. I e-scribed rx.   She wanted to verify if she can continue both Pilates and PT. Or if Dr. Epimenio Foot recommends she reduce these or stop. Feels they are beneficial. Advised I will speak w/ MD and call back.  Aware I faxed in her start form for Radivaca. Waiting on benefit investigation. She may receive call from [employer] support program to verify her info. She verbalized understanding.

## 2020-12-13 NOTE — Telephone Encounter (Signed)
Pt has question [id number], she is wanting to change pharmacies.  Pt is now wanting to change to [facility] on [address] in [place].  Pt states she also has a few questions re: things that were discussed during her office visit on yesterday.

## 2020-12-13 NOTE — Addendum Note (Signed)
Addended by: Arther Abbott on: 12/13/2020 10:07 AM   Modules accepted: Orders

## 2020-12-13 NOTE — Telephone Encounter (Addendum)
Spoke w/ MD. Alexa Jackson is what can be sent to [facility] instead. [facility]. Pt picked up rx 12/12/20.   Radicava comes from specialty pharmacy. She signed a separate start form for this that we sent in.  I will call to see what questions she had. If need be, Dr. Epimenio Foot can call her later today to further discuss things.

## 2020-12-18 NOTE — Telephone Encounter (Signed)
[employer] [name]) called, want to discuss process for helping patient gain access to Radicava therapy.   Contact info: [phone]

## 2020-12-18 NOTE — Telephone Encounter (Signed)
Called and spoke to [place], wants to speak to [facility] regarding special procedure for authorization of this medication for patient.

## 2020-12-24 DIAGNOSIS — R296 Repeated falls: Secondary | ICD-10-CM | POA: Diagnosis not present

## 2020-12-24 DIAGNOSIS — G1220 Motor neuron disease, unspecified: Secondary | ICD-10-CM | POA: Diagnosis not present

## 2020-12-24 DIAGNOSIS — M6250 Muscle wasting and atrophy, not elsewhere classified, unspecified site: Secondary | ICD-10-CM | POA: Diagnosis not present

## 2020-12-24 NOTE — Telephone Encounter (Signed)
Took call from phone staff and spoke w/ [name]. She is the Production designer, theatre/television/film for patient access for Radicava. She will e-mail me exception form, what documents to include and template letter to send to insurance to try and get med approved. Med just got FDA approved in the last month and some insurances are in process of scheduling review of drug to be placed on formulary still. She will send late this afternoon to me as she is traveling in the car currently.

## 2020-12-24 NOTE — Telephone Encounter (Signed)
LVM returning Alexa Jackson's call at number she provided below

## 2020-12-25 NOTE — Telephone Encounter (Signed)
Radicava ORS starter kit approved 09/18/2020 - 07/20/2021. Faxed notice of approval to [employer] at [phone]. Received fax confirmation. Tried initiating separate PA for maintenance dose on CMM but received the following response: "The patient currently has access to the requested medication and a Prior Authorization is not needed for the patient/medication." Key: [id number].

## 2020-12-25 NOTE — Telephone Encounter (Signed)
Received fax back from [facility] that they do not process PA requests for patient. I submitted a PA on CMM. KeyLoann Quill - PA Case ID: [id number]. Waiting on determination from [facility].  "Your information has been submitted to [employer]. [employer] will review the request and will issue a decision, typically within 1-3 days from your submission. You can check the updated outcome later by reopening this request. If [employer] has not responded in 1-3 days or if you have any questions about your ePA request, please contact [employer] at [phone]. If you think there may be a problem with your PA request, use our live chat feature at the bottom right."  I called and LVM for Tammy to provide an update.

## 2020-12-25 NOTE — Telephone Encounter (Signed)
Faxed completed PA form (marked urgent), letter of medical necessity, office notes to [employer] at [phone]. Received fax confirmation. Called Alexa Jackson back and informed her this has been sent. She will let [employer] know. She will have them check on status on Friday. Advised if I hear anything before then, I will call her. She verbalized understanding.

## 2020-12-25 NOTE — Telephone Encounter (Signed)
Called and spoke w/ [name]. Did not receive PA form via email. She resent, I confirmed receipt. She states our office needs to initiate PA process to help move things a long more quickly. Once we submit PA, she would like a call so she can then let [employer]) know PA was submitted.

## 2020-12-25 NOTE — Telephone Encounter (Signed)
Called and LVM for Tammy to let her know about approval. Asked her to call back if she has any further questions or concerns.

## 2021-01-02 NOTE — Telephone Encounter (Addendum)
Took call from phone staff and spoke w/ Nancy/Radicava rep. She was calling back in response to my email yesterday requesting update on pt. She states [facility] working on their own benefit investigation currently. They have to do this even though we have approval. Once done, they will reach out to pt to collect copay if needed and set up shipment.   [facility] Tel: [phone] Fax: [phone]   LVM for pt relaying above info and asked her to call back with any questions she may have.

## 2021-01-10 NOTE — Telephone Encounter (Signed)
[facility]. Spoke w/ Cala Bradford who transferred me to Shay C. Confirmed pt Radicava should have medication delivered to her tomorrow. Grant applied towards copay. Nothing further needed at this time.

## 2021-01-15 NOTE — Telephone Encounter (Signed)
LVM for pt to call office back. Wanting to make sure she received Radicava and started on medication ok.

## 2021-01-15 NOTE — Telephone Encounter (Signed)
Pt called office back. Received Radicava on 01/11/21 and started it on 01/12/21. She is tolerating well.  [employer] sent clinical nurse out a month ago to go over things/educate. Provided her  personal planner for Radicava to track when she takes it/how she is tolerating. She will bring this with her to next appt in August. She received grant via [employer] for medication. Nothing further needed at this time. Encouraged her to call back if she has future questions/concerns.

## 2021-01-16 NOTE — Telephone Encounter (Signed)
Took call from phone staff and spoke w/ [name] (patient coordinator for Radicava). She wanted update on pt. Advised I spoke w/ pt yesterday who reported she received medication 01/11/21 and started it on 01/12/21. She is tolerating well. She verbalized understanding and appreciation.

## 2021-02-05 ENCOUNTER — Ambulatory Visit: Payer: Medicare HMO | Admitting: Diagnostic Neuroimaging

## 2021-02-05 ENCOUNTER — Ambulatory Visit: Payer: Medicare HMO | Admitting: Neurology

## 2021-02-21 ENCOUNTER — Telehealth: Payer: Self-pay | Admitting: Neurology

## 2021-02-21 NOTE — Telephone Encounter (Addendum)
Called pt back to further discuss. She had no issues with first dose last month (last week of June/first week of July). She is currently in process of second dose (received last week in July/first of August). This past Sunday, she started having fatigue in leg muscles. Hard to stand up or walk through the house. Monday, she fell. She was cleaning out Insurance claims handler. Bent over to low and legs collapsed. She could not stand up by herself d/t weakness. Took her 45 min to get up. This morning, she was leaving house to visit sister in hospital. Stepped out front door and legs collapsed again and she fell on her walker. Denies any broken bones, did not hit head. No signs/sx infection. No new meds added recently. Not having any typical SE from Radicava.  Did experience mild bruising from Rilutek when she first started on med. Occurs occasionally now but not often.  Advised I will discuss w/ MD and call back.

## 2021-02-21 NOTE — Telephone Encounter (Signed)
LVM for pt letting her know I spoke w/ MD and he would like her to come in for an appt. Advised I have appt on hold for 02/26/21 at 11:30am w/ Dr. Epimenio Foot. Asked her to call back before 5pm today to let us know if this works. Advised we are closed tomorrow.

## 2021-02-21 NOTE — Telephone Encounter (Signed)
Pt called back and spoke w/ phone staff. Accepted appt below. She was scheduled by phone staff.

## 2021-02-21 NOTE — Telephone Encounter (Signed)
Pt is asking for a call to discuss concerns she has about the Edaravone (RADICAVA IV)

## 2021-02-26 ENCOUNTER — Other Ambulatory Visit: Payer: Self-pay

## 2021-02-26 ENCOUNTER — Ambulatory Visit: Payer: Medicare HMO | Admitting: Neurology

## 2021-02-26 ENCOUNTER — Encounter: Payer: Self-pay | Admitting: Neurology

## 2021-02-26 VITALS — BP 147/82 | HR 74 | Ht 63.0 in | Wt 145.0 lb

## 2021-02-26 DIAGNOSIS — R269 Unspecified abnormalities of gait and mobility: Secondary | ICD-10-CM | POA: Diagnosis not present

## 2021-02-26 DIAGNOSIS — R531 Weakness: Secondary | ICD-10-CM | POA: Diagnosis not present

## 2021-02-26 DIAGNOSIS — G1221 Amyotrophic lateral sclerosis: Secondary | ICD-10-CM | POA: Diagnosis not present

## 2021-02-26 NOTE — Progress Notes (Signed)
[facility]  PATIENT: Alexa Jackson DOB: 01/07/1956  REFERRING DOCTOR OR PCP: Joycelyn Rua, MD SOURCE: Patient, notes from primary care, notes from emergency room, imaging reports, MRI images personally reviewed.  _________________________________   HISTORICAL  CHIEF COMPLAINT:  Chief Complaint  Patient presents with   Follow-up    RM 2, alone. Last seen 11/2020. phone note 02/21/21: "Had no issues w/ first dose last month (last week of June/first week of July). She is currently in process of second dose (received last week in July/first of August). This past Sunday, she started having fatigue in leg muscles. Hard to stand up or walk through the house.    Other    Cont.Marland KitchenMarland KitchenMonday, she fell. Was cleaning out cat litter box. Bent over too low and legs collapsed. Could not stand up by herself Took 45 min to get up. This morning, she was leaving house to visit sister in hospital. Stepped out front door and legs collapsed again and she fell on her walker. Denies any broken bones, did not hit head. No signs/sx infection. No new meds added recently. Not having any typical SE from Radicava. Did experience mild bruising from Rilutek when she first started on med."   Radicava    Finished second round 02/22/21 and has had no sx since then. No falls. In [facility] in office today, able to stand to get weight.    HISTORY OF PRESENT ILLNESS:  Alexa Jackson is a 65 y.o. woman with ALS  UPDATE 02/26/2021: She started to have trouble with weakness and gait in 2020 and it has slowly worsened.    We diagnosed her with ALS 12/11/2020 after NCV/EMG and started Rilutek and Radicava oral.      She tolerated the 1st 2 weeks of Radicava well.   However, during hr second cycle she felt her legs were a little weaker a few times and she had a bad fall.      She uses a walker and can go 100-200 feet without stopping.   Sbe can not walk without the walker.      She feels he muscle fasciculations  have improved a bit in the limbs but she is having right facial twitches that close her eyes.    She has had hot flashes since starting Rilutek.      ALS history: She is a 65 year old woman with a history of scoliosis surgery (Harrington rods in the 1970s) who has had progressive difficulty with back strength and frequent falls over the last couple years.   She had 3 falls last month, including one on stairs.    She began to notice issues with core strength and gait around Spring 2020   At the time, she was caring for hr aunt who was heavy.   After she passed, she had to clean out her aunt's house which toik dys of labor.   She began to notice weakness in her core, mostly lower back, and sometimes legs after she walked more.    She had an emergency room visit 01/18/2019 due to weakness in her lower back and core, and gait difficulties.    She started to use a cane around February 2021 and a walker around June 2021.   She feels more stable with the walkr.   She saw Dr. Wynetta Emery in 2020 and she reports he felt surgery would not be necessary at that time.   Symptoms progressed and I first saw her 11/27/2020.  On exam, I noticed atrophy in  intrinsic hand muscles.    Additionally she had weakness and twitching in the fingers and right face.  An EMG was done 12/11/2020 consistent with ALS.  She was started on Radicava and Rikutek/   .   IMAGING: I reviewed the MRI of the lumbar spine from 02/10/2019.  There are Harrington rods terminating at L3.  At L2-L3 there are degenerative changes with facet hypertrophy but no nerve root compression or spinal stenosis.  At L3-L4, there is severe facet hypertrophy with a joint effusion on the left and mild spinal stenosis.  There is mild to moderate right lateral recess stenosis and mild bilateral foraminal narrowing but does not appear to be nerve root compression.  At L4-L5, there is mild spinal stenosis due to facet hypertrophy, ligamentum flavum hypertrophy, 2 mm of  anterolisthesis and disc bulging.  There is moderate bilateral foraminal narrowing and mild to moderate left lateral recess stenosis but no definite nerve root compression.  At L5-S1 there are degenerative changes but no spinal stenosis or nerve root compression.  Also reviewed the CT scan of the thoracic and lumbar spine from 01/18/2019.  It showed the Harrington rods from T3-L3 with solid fusion and residual thoracolumbar scoliosis convex to the right  CMP 08/05/2018 was normal.  REVIEW OF SYSTEMS: Constitutional: No fevers, chills, sweats, or change in appetite Eyes: No visual changes, double vision, eye pain Ear, nose and throat: No hearing loss, ear pain, nasal congestion, sore throat Cardiovascular: No chest pain, palpitations Respiratory:  No shortness of breath at rest or with exertion.   No wheezes GastrointestinaI: No nausea, vomiting, diarrhea, abdominal pain, fecal incontinence Genitourinary:  No dysuria, urinary retention or frequency.  No nocturia. Musculoskeletal:  No neck pain, back pain Integumentary: No rash, pruritus, skin lesions Neurological: as above Psychiatric: No depression at this time.  No anxiety Endocrine: No palpitations, diaphoresis, change in appetite, change in weigh or increased thirst Hematologic/Lymphatic:  No anemia, purpura, petechiae. Allergic/Immunologic: No itchy/runny eyes, nasal congestion, recent allergic reactions, rashes  ALLERGIES: No Known Allergies  HOME MEDICATIONS:  Current Outpatient Medications:    Edaravone (RADICAVA IV), Inject into the vein. NOT IV-PT PLACED ON ORAL Starter: Administer 105mg  (85ml) orally once daily for 14 consecutive days, followed by 14-day drug free period, qty: 63ml, no refills. Subsequent dose: Administer 105mg  (90ml) orally for 10 days out of 14 days, followed by 14-day drug free periods. Qty 64ml, refills: 11, Disp: , Rfl:    riluzole (RILUTEK) 50 MG tablet, Take 1 tablet (50 mg total) by mouth every 12 (twelve)  hours., Disp: 180 tablet, Rfl: 2  PAST MEDICAL HISTORY: Past Medical History:  Diagnosis Date   Scoliosis 1977    PAST SURGICAL HISTORY: Past Surgical History:  Procedure Laterality Date   BACK SURGERY     for scoliosis    FAMILY HISTORY: Family History  Problem Relation Age of Onset   Breast cancer Mother    ALS Father     SOCIAL HISTORY:  Social History   Socioeconomic History   Marital status: Single    Spouse name: Not on file   Number of children: 0   Years of education: Not on file   Highest education level: Bachelor's degree (e.g., BA, AB, BS)  Occupational History    Comment: retired from [employer], [employer]  Tobacco Use   Smoking status: Never   Smokeless tobacco: Never  Vaping Use   Vaping Use: Never used  Substance and Sexual Activity   Alcohol use: Never  Drug use: Never   Sexual activity: Not on file  Other Topics Concern   Not on file  Social History Narrative   lives alone   Social Determinants of Health   Financial Resource Strain: Not on file  Food Insecurity: Not on file  Transportation Needs: Not on file  Physical Activity: Not on file  Stress: Not on file  Social Connections: Not on file  Intimate Partner Violence: Not on file     PHYSICAL EXAM  Vitals:   02/26/21 1114  BP: (!) 147/82  Pulse: 74  SpO2: 98%  Weight: 145 lb (65.8 kg)  Height: 5\' 3"  (1.6 m)    Body mass index is 25.69 kg/m.   General: The patient is well-developed and well-nourished and in no acute distress  HEENT:  Head is [place]/AT.  Sclera are anicteric.    Neck: The neck is nontender.  Skin: Extremities are without rash or  edema.  Musculoskeletal:  Back is nontender  Neurologic Exam  Mental status: The patient is alert and oriented x 3 at the time of the examination. The patient has apparent normal recent and remote memory, with an apparently normal attention span and concentration ability.   Speech is normal.  Cranial nerves: Extraocular  movements are full.  Facial strength appears normal.  Facial sensation was normal.  She has frequent twitching in the right face.  With several twitches in a row  she has ptosis.  No dysarthria is noted.   No obvious hearing deficits are noted.  Motor:  She has atrophy in intrinsic hand muscles, normal muscle tone.     Strength (R/L) is Deltoid 4-/4, triceps 4-/4-, biceps 4/4, finger extensors, 2+/3; finger flexors 4+/4+, intrinsic hand muscles 3/3 Hip flexors  3/3 ; Knee extensors 4/4, knee flexors 4-/4-; Ankle extensors 2/2 ankle flexors 4-/4-; toe extensors 2-/2-  Sensory: Sensory testing is intact to pinprick, soft touch and vibration sensation in all 4 extremities.  Coordination: Cerebellar testing reveals good finger-nose-finger and heel-to-shin bilaterally.  Gait and station: She needs to use her arms to stand up from the chair.  She needs support to stand.  Romberg is negative.  She has difficulty walking and needs a walker.  Reflexes: Deep tendon reflexes are symmetric and normal bilaterally.     DIAGNOSTIC DATA (LABS, IMAGING, TESTING) - I reviewed patient records, labs, notes, testing and imaging myself where available.  No results found for: WBC, HGB, HCT, MCV, PLT    Component Value Date/Time   NA 141 08/05/2018 1220   K 4.1 08/05/2018 1220   CL 106 08/05/2018 1220   CO2 26 08/05/2018 1220   GLUCOSE 93 08/05/2018 1220   BUN 14 08/05/2018 1220   CREATININE 0.61 08/05/2018 1220   CALCIUM 9.5 08/05/2018 1220   PROT 7.1 11/27/2020 0851   AST 25 08/05/2018 1220   ALT 18 08/05/2018 1220   BILITOT 0.5 08/05/2018 1220   GFRNONAA 97 08/05/2018 1220   GFRAA 113 08/05/2018 1220       ASSESSMENT AND PLAN  Amyotrophic lateral sclerosis (ALS) (HCC) - Plan: Ambulatory referral to Neurology  Weakness  Gait disturbance - Plan: Ambulatory referral to Neurology   Continue Radicava.  Continue Rilutek We discussed some natural history of ALS.  It is probable that she will  show progressive weakness despite being on the medication.  Over time many issues may arise related to her ALS.  She might be able to get additional services through an ALS clinic.  [facility] She will return to see me in about 4 months or sooner if there are new or worsening neurologic symptoms.  Kyrstal Monterrosa A. Epimenio FootSater, MD, [facility],FAAN 02/26/2021, 12:10 PM Certified in Neurology, Clinical Neurophysiology, Sleep Medicine and Neuroimaging  [facility] [address], [address] [place], [place] [id number] [phone]

## 2021-02-28 ENCOUNTER — Telehealth: Payer: Self-pay | Admitting: *Deleted

## 2021-03-12 ENCOUNTER — Ambulatory Visit: Payer: Medicare HMO | Admitting: Neurology

## 2021-03-14 IMAGING — CT CT LUMBAR SPINE WITHOUT CONTRAST
3 of 7 series · 11 of 33 positions shown, 13 images · non-contrast
Comparison: Chest radiographs 05/30/2014.

CLINICAL DATA: Increasing weakness with standing and ambulation
over the last year. History of remote scoliosis surgery. No recent
fall.

EXAM:
CT THORACIC AND LUMBAR SPINE WITHOUT CONTRAST
TECHNIQUE: Multidetector CT imaging of the thoracic and lumbar spine was
performed without contrast. Multiplanar CT image reconstructions
were also generated.

[Series 6: t spine soft · axial · 0.40mm/px · z∈[-510,-142]mm · 5 of 276 slices shown, 7 images]
[im 46/276  soft-tissue]
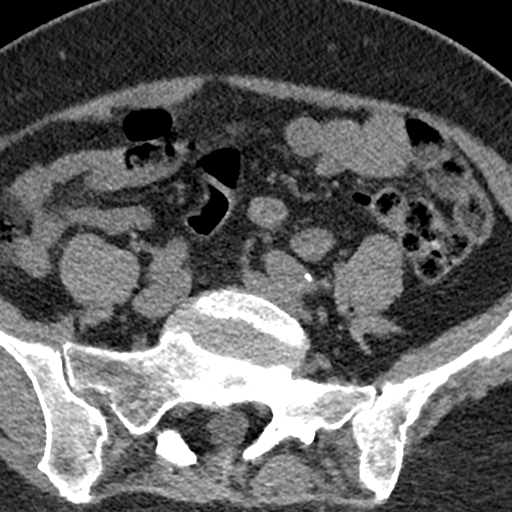
[im 46/276  bone]
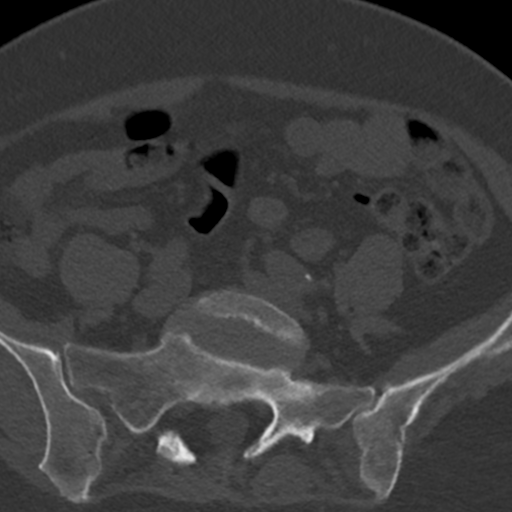
[im 92/276  bone]
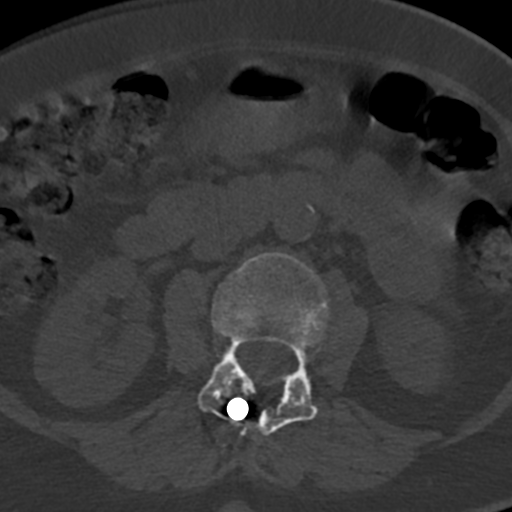
[im 138/276  bone]
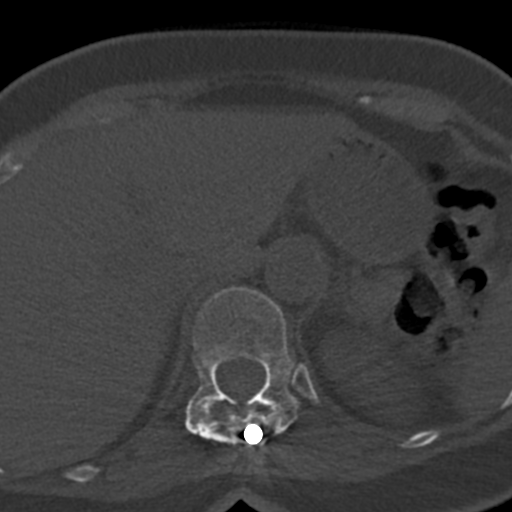
[im 184/276  bone]
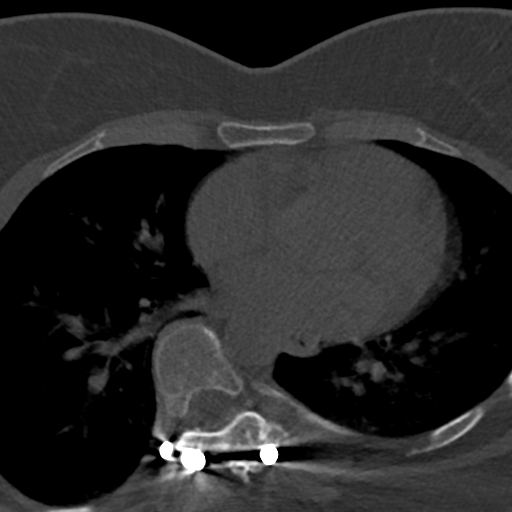
[im 230/276  soft-tissue]
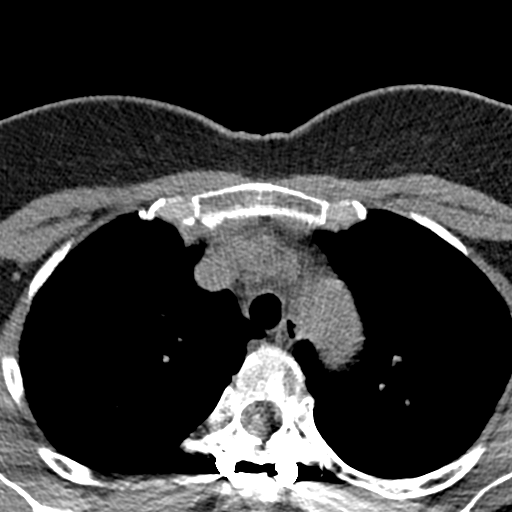
[im 230/276  bone]
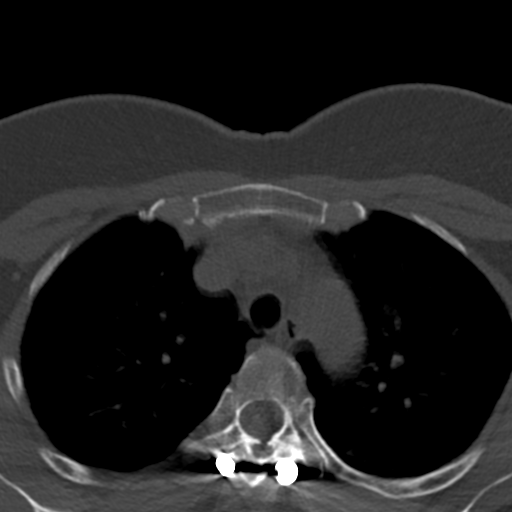

[Series 9: sagittal bone lspine · sagittal · 0.38mm/px · 5 of 102 slices shown]
[im 17/102  bone]
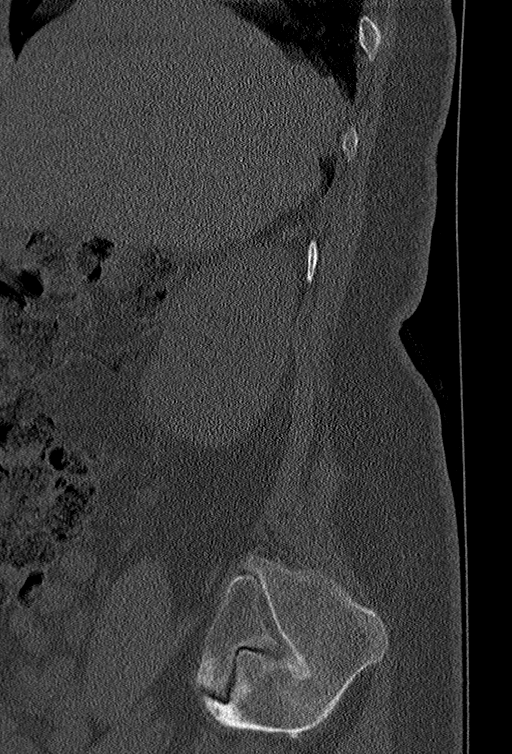
[im 34/102  bone]
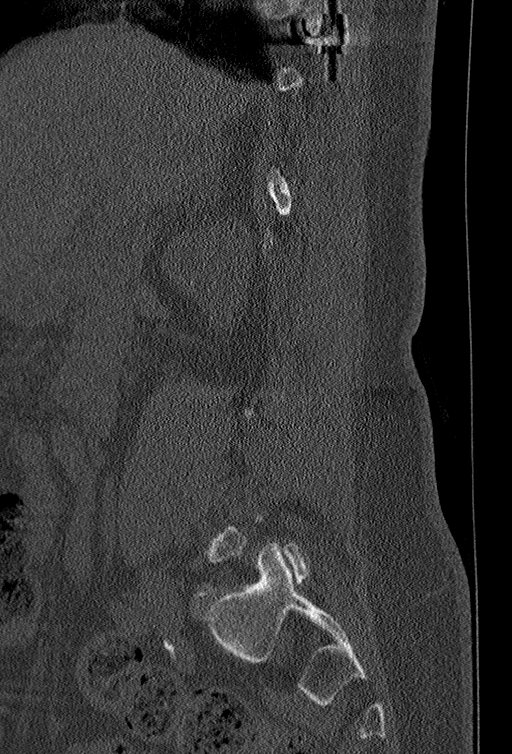
[im 51/102  bone]
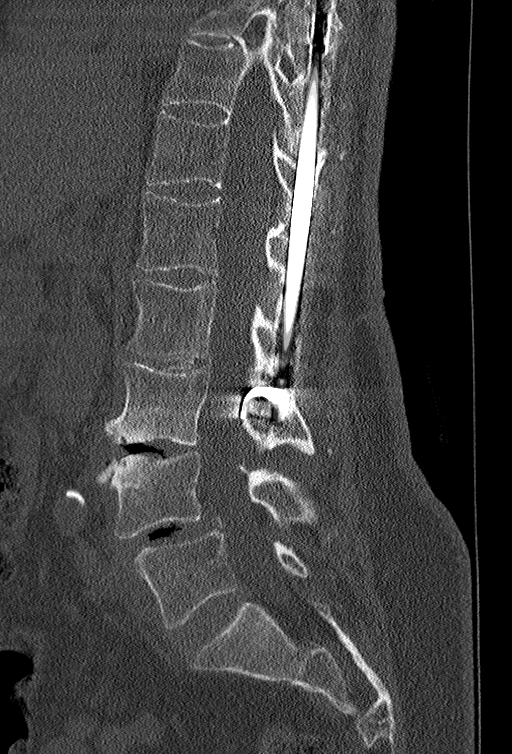
[im 68/102  bone]
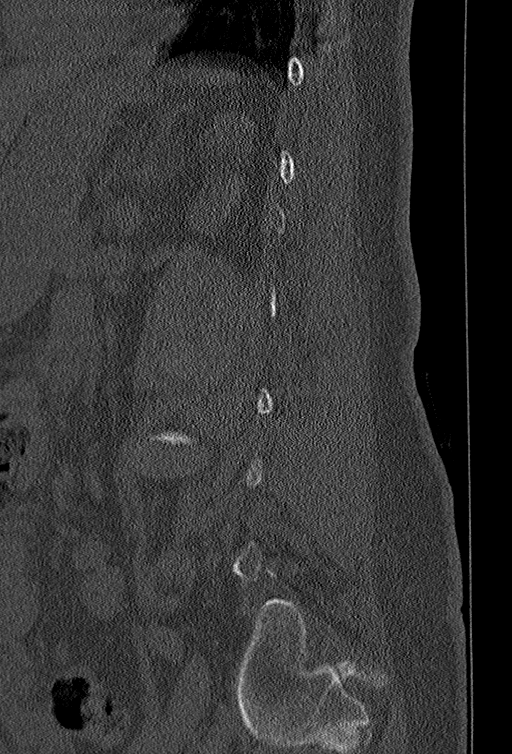
[im 85/102  bone]
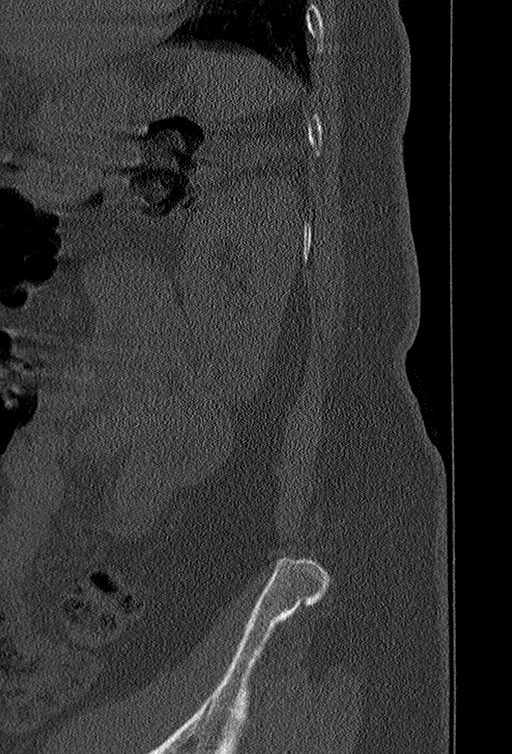

[Series 10: coronal bone · coronal · 0.29mm/px · 1 of 83 slices shown]
[im 42/83  bone]
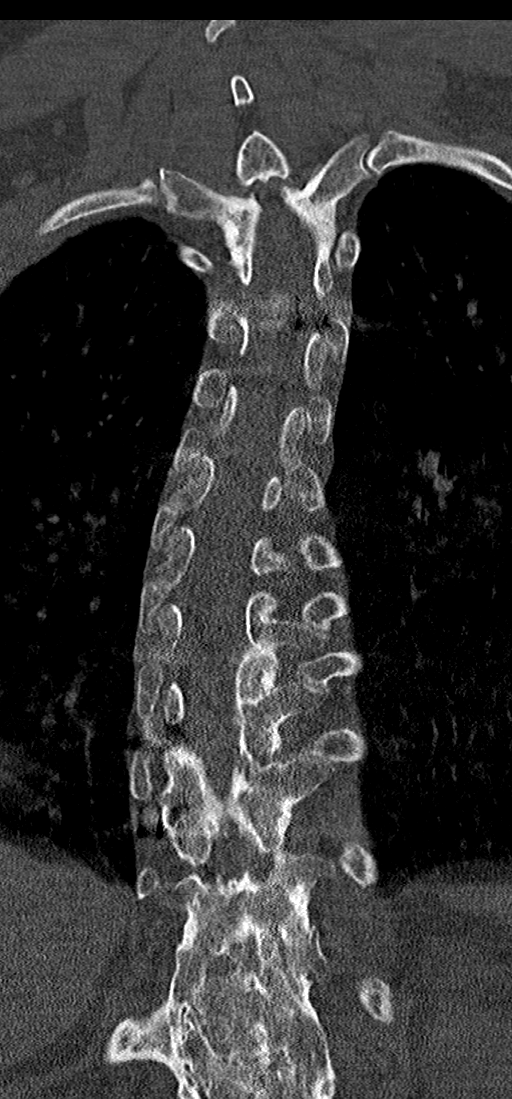

[11 of 33 positions shown; findings below may reference images not displayed]

FINDINGS: CT THORACIC SPINE FINDINGS

Alignment: There is conventional anatomy with 12 rib-bearing
thoracic type vertebral bodies and 5 lumbar type vertebral bodies.
There is a straightening of the usual thoracic kyphosis with an
S-shaped scoliosis, convex to the left at T2 and to the right at T8,
measuring approximately 36 degrees.

Vertebrae: Status post Harrington rod fixation of the thoracolumbar
spine. On the right, there is a rod extending from T3-4 to T10 which
appears intact. On left, the rod extends from T3 into the mid lumbar
spine. This rod is also intact. There is no evidence of acute
fracture. Posterolateral osseous fusion appears solid bilaterally.

Paraspinal and other soft tissues: There are no paraspinal
abnormalities. The lungs appear clear.

Disc levels: The thoracic spinal canal appears patent at all levels.
No large disc herniation, spinal stenosis or nerve root encroachment
identified.

CT LUMBAR SPINE FINDINGS

Segmentation: Normal.  The last open disc space is L5-S1.

Alignment: There is a degenerative grade 1 anterolisthesis at L4-5
measuring 6 mm. The lateral alignment is otherwise normal. There is
a convex left scoliosis centered at L2, measuring approximately 24
degrees.

Vertebrae: As above, status post thoracolumbar Harrington rod
fusion. The left-sided thoracic rod extends across midline into the
posterior elements on the right in the lumbar region. This rod is
intact and extends to the L3 level. There is no evidence of acute
fracture or pars defect. The fusion appears solid at the operative
levels.

Paraspinal and other soft tissues: No significant paraspinal
findings. There is mild aortoiliac atherosclerosis.

Disc levels: The spinal canal is widely patent at and above the L2-3
disc space level. There is no spinal stenosis or nerve root
encroachment.

L3-4: Moderate adjacent segment disease with loss of disc height,
annular disc bulging and vacuum phenomenon. There is moderate facet
and ligamentous hypertrophy. These factors contribute to
mild-to-moderate spinal stenosis with mild narrowing of the foramina
bilaterally.

L4-5: There is disc degeneration with loss of disc height, annular
disc bulging and vacuum phenomenon. There is advanced facet and
ligamentous hypertrophy accounting for the grade 1 anterolisthesis.
These factors contribute to mild-to-moderate spinal stenosis with
asymmetric narrowing of the left lateral recess and right foramen.

L5-S1: Disc height is maintained. There is asymmetric facet
hypertrophy on the left without significant resulting spinal
stenosis or nerve root encroachment.
IMPRESSION: 1. Status post Harrington rod fixation of the thoracolumbar spine
with intact hardware extending from T3 through L3. The fusion
appears solid.
2. No acute osseous findings.
3. Residual thoracolumbar scoliosis with greatest component convex
to the right in the lower thoracic region, measuring approximately
36 degrees.
4. Moderate spondylosis at L3-4 and L4-5 with disc degeneration,
annular bulging and facet hypertrophy. Resulting mild-to-moderate
spinal stenosis with mild narrowing of the lateral recesses and
foramina as described.

## 2021-03-20 ENCOUNTER — Ambulatory Visit: Payer: Medicare HMO | Admitting: Neurology

## 2021-03-26 DIAGNOSIS — G1221 Amyotrophic lateral sclerosis: Secondary | ICD-10-CM | POA: Diagnosis not present

## 2021-03-26 DIAGNOSIS — X58XXXA Exposure to other specified factors, initial encounter: Secondary | ICD-10-CM | POA: Diagnosis not present

## 2021-03-26 DIAGNOSIS — T148XXA Other injury of unspecified body region, initial encounter: Secondary | ICD-10-CM | POA: Diagnosis not present

## 2021-03-26 DIAGNOSIS — R531 Weakness: Secondary | ICD-10-CM | POA: Diagnosis not present

## 2021-03-26 DIAGNOSIS — R232 Flushing: Secondary | ICD-10-CM | POA: Diagnosis not present

## 2021-05-03 DIAGNOSIS — G1221 Amyotrophic lateral sclerosis: Secondary | ICD-10-CM | POA: Diagnosis not present

## 2021-05-13 DIAGNOSIS — M419 Scoliosis, unspecified: Secondary | ICD-10-CM | POA: Diagnosis not present

## 2021-05-13 DIAGNOSIS — G1221 Amyotrophic lateral sclerosis: Secondary | ICD-10-CM | POA: Diagnosis not present

## 2021-05-13 DIAGNOSIS — Z9181 History of falling: Secondary | ICD-10-CM | POA: Diagnosis not present

## 2021-05-13 DIAGNOSIS — Z993 Dependence on wheelchair: Secondary | ICD-10-CM | POA: Diagnosis not present

## 2021-05-17 DIAGNOSIS — Z993 Dependence on wheelchair: Secondary | ICD-10-CM | POA: Diagnosis not present

## 2021-05-17 DIAGNOSIS — G1221 Amyotrophic lateral sclerosis: Secondary | ICD-10-CM | POA: Diagnosis not present

## 2021-05-17 DIAGNOSIS — M419 Scoliosis, unspecified: Secondary | ICD-10-CM | POA: Diagnosis not present

## 2021-05-17 DIAGNOSIS — Z9181 History of falling: Secondary | ICD-10-CM | POA: Diagnosis not present

## 2021-05-22 DIAGNOSIS — R2681 Unsteadiness on feet: Secondary | ICD-10-CM | POA: Diagnosis not present

## 2021-05-22 DIAGNOSIS — Z79899 Other long term (current) drug therapy: Secondary | ICD-10-CM | POA: Diagnosis not present

## 2021-05-22 DIAGNOSIS — G1221 Amyotrophic lateral sclerosis: Secondary | ICD-10-CM | POA: Diagnosis not present

## 2021-05-22 DIAGNOSIS — R531 Weakness: Secondary | ICD-10-CM | POA: Diagnosis not present

## 2021-05-23 DIAGNOSIS — Z9181 History of falling: Secondary | ICD-10-CM | POA: Diagnosis not present

## 2021-05-23 DIAGNOSIS — G1221 Amyotrophic lateral sclerosis: Secondary | ICD-10-CM | POA: Diagnosis not present

## 2021-05-23 DIAGNOSIS — Z993 Dependence on wheelchair: Secondary | ICD-10-CM | POA: Diagnosis not present

## 2021-05-23 DIAGNOSIS — M419 Scoliosis, unspecified: Secondary | ICD-10-CM | POA: Diagnosis not present

## 2021-05-27 ENCOUNTER — Ambulatory Visit: Payer: Medicare HMO | Admitting: Neurology

## 2021-05-27 ENCOUNTER — Encounter: Payer: Self-pay | Admitting: Neurology

## 2021-05-27 VITALS — BP 140/77 | HR 79 | Ht 63.0 in | Wt 141.5 lb

## 2021-05-27 DIAGNOSIS — R531 Weakness: Secondary | ICD-10-CM

## 2021-05-27 DIAGNOSIS — M6250 Muscle wasting and atrophy, not elsewhere classified, unspecified site: Secondary | ICD-10-CM

## 2021-05-27 DIAGNOSIS — G1221 Amyotrophic lateral sclerosis: Secondary | ICD-10-CM

## 2021-05-27 NOTE — Progress Notes (Signed)
[facility]  PATIENT: Alexa Jackson DOB: 09-07-1955  REFERRING DOCTOR OR PCP: Joycelyn Rua, MD SOURCE: Patient, notes from primary care, notes from emergency room, imaging reports, MRI images personally reviewed.  _________________________________   HISTORICAL  CHIEF COMPLAINT:  Chief Complaint  Patient presents with   Follow-up    Rm 2, alone. Here for 3 month ALS f/u. Pt reports doing a little better. Pt reports a fall a month ago, no significant injury. Pt is wondering if she will continue to have founding for radicava and Rilutek.     HISTORY OF PRESENT ILLNESS:  Alexa Jackson is a 65 y.o. woman with ALS  UPDATE 05/27/2021: She is tolerating Radicava and Rilutek well.    She gets Radicava through one of the charity organization.    She is noting right facial spasms.   She does not have any shortness of breath or difficulty swallowing.   Legs are weak bilaterally.   She has right > left hand weakness.   Around the house, she can walk with a walker and actually feels stronger now than over the summer.   Cannot walk more than a step or two without the walker.     She started to have trouble with weakness and gait in 2020 and it has slowly worsened.    We diagnosed her with ALS 12/11/2020 after NCV/EMG and started Rilutek and Radicava oral.      She tolerated the 1st 2 weeks of Radicava well.   However, during hr second cycle she felt her legs were a little weaker a few times and she had a bad fall.      She has seen the multidisciplinary ALS clinic at [facility] and has had genetic testing.   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  Her father had bulbar ALS.   She has arm > leg muscle fasciculations that are variable.      She has had hot flashes since starting Rilutek.       ALS history: She is a 65 year old woman with a history of scoliosis surgery (Harrington rods in the 1970s)  who has had progressive difficulty with back strength and frequent falls over the last couple years.   She had 3 falls last month, including one on stairs.    She began to notice issues with core strength and gait around Spring 2020   At the time, she was caring for hr aunt who was heavy.   After she passed, she had to clean out her aunt's house which toik dys of labor.   She began to notice weakness in her core, mostly lower back, and sometimes legs after she walked more.    She had an emergency room visit 01/18/2019 due to weakness in her lower back and core, and gait difficulties.    She started to use a cane around February 2021 and a walker around June 2021.   She feels more stable with the walkr.   She saw Dr. Wynetta Emery in 2020 and she reports he felt surgery would not be necessary at that time.   Symptoms progressed and I first saw her 11/27/2020.  On exam, I noticed atrophy in intrinsic hand muscles.    Additionally she had weakness and twitching in the fingers and right face.  An EMG was done 12/11/2020 consistent with ALS.  She was started on Radicava and Rikutek/   .   IMAGING: I  reviewed the MRI of the lumbar spine from 02/10/2019.  There are Harrington rods terminating at L3.  At L2-L3 there are degenerative changes with facet hypertrophy but no nerve root compression or spinal stenosis.  At L3-L4, there is severe facet hypertrophy with a joint effusion on the left and mild spinal stenosis.  There is mild to moderate right lateral recess stenosis and mild bilateral foraminal narrowing but does not appear to be nerve root compression.  At L4-L5, there is mild spinal stenosis due to facet hypertrophy, ligamentum flavum hypertrophy, 2 mm of anterolisthesis and disc bulging.  There is moderate bilateral foraminal narrowing and mild to moderate left lateral recess stenosis but no definite nerve root compression.  At L5-S1 there are degenerative changes but no spinal stenosis or nerve root compression.  Also  reviewed the CT scan of the thoracic and lumbar spine from 01/18/2019.  It showed the Harrington rods from T3-L3 with solid fusion and residual thoracolumbar scoliosis convex to the right  CMP 08/05/2018 was normal.  REVIEW OF SYSTEMS: Constitutional: No fevers, chills, sweats, or change in appetite Eyes: No visual changes, double vision, eye pain Ear, nose and throat: No hearing loss, ear pain, nasal congestion, sore throat Cardiovascular: No chest pain, palpitations Respiratory:  No shortness of breath at rest or with exertion.   No wheezes GastrointestinaI: No nausea, vomiting, diarrhea, abdominal pain, fecal incontinence Genitourinary:  No dysuria, urinary retention or frequency.  No nocturia. Musculoskeletal:  No neck pain, back pain Integumentary: No rash, pruritus, skin lesions Neurological: as above Psychiatric: No depression at this time.  No anxiety Endocrine: No palpitations, diaphoresis, change in appetite, change in weigh or increased thirst Hematologic/Lymphatic:  No anemia, purpura, petechiae. Allergic/Immunologic: No itchy/runny eyes, nasal congestion, recent allergic reactions, rashes  ALLERGIES: No Known Allergies  HOME MEDICATIONS:  Current Outpatient Medications:    Edaravone (RADICAVA IV), Inject into the vein. NOT IV-PT PLACED ON ORAL Starter: Administer 105mg  (38ml) orally once daily for 14 consecutive days, followed by 14-day drug free period, qty: 91ml, no refills. Subsequent dose: Administer 105mg  (35ml) orally for 10 days out of 14 days, followed by 14-day drug free periods. Qty 32ml, refills: 11, Disp: , Rfl:    riluzole (RILUTEK) 50 MG tablet, Take 1 tablet (50 mg total) by mouth every 12 (twelve) hours., Disp: 180 tablet, Rfl: 2  PAST MEDICAL HISTORY: Past Medical History:  Diagnosis Date   Scoliosis 1977    PAST SURGICAL HISTORY: Past Surgical History:  Procedure Laterality Date   BACK SURGERY     for scoliosis    FAMILY HISTORY: Family History   Problem Relation Age of Onset   Breast cancer Mother    ALS Father     SOCIAL HISTORY:  Social History   Socioeconomic History   Marital status: Single    Spouse name: Not on file   Number of children: 0   Years of education: Not on file   Highest education level: Bachelor's degree (e.g., BA, AB, BS)  Occupational History    Comment: retired from [employer], [employer]  Tobacco Use   Smoking status: Never   Smokeless tobacco: Never  Vaping Use   Vaping Use: Never used  Substance and Sexual Activity   Alcohol use: Never   Drug use: Never   Sexual activity: Not on file  Other Topics Concern   Not on file  Social History Narrative   lives alone   Social Determinants of Health   Financial Resource Strain: Not on  file  Food Insecurity: Not on file  Transportation Needs: Not on file  Physical Activity: Not on file  Stress: Not on file  Social Connections: Not on file  Intimate Partner Violence: Not on file     PHYSICAL EXAM  Vitals:   05/27/21 1314  BP: 140/77  Pulse: 79  Weight: 141 lb 8 oz (64.2 kg)  Height: 5\' 3"  (1.6 m)    Body mass index is 25.07 kg/m.   General: The patient is well-developed and well-nourished and in no acute distress  HEENT:  Head is [place]/AT.  Sclera are anicteric.    Neck: The neck is nontender.  Skin: Extremities are without rash or  edema.  Musculoskeletal:  Back is nontender  Neurologic Exam  Mental status: The patient is alert and oriented x 3 at the time of the examination. The patient has apparent normal recent and remote memory, with an apparently normal attention span and concentration ability.   Speech is normal.  Cranial nerves: Extraocular movements are full.  Facial strength appears normal.  Facial sensation was normal.  She has frequent twitching in the right face.  With several twitches in a row  she has ptosis.  No dysarthria is noted.  No obvious hearing deficits are noted.  Motor:  She has atrophy in intrinsic  hand muscles, normal muscle tone.     Strength (R/L) is Deltoid 3/4, triceps 4-/4-, biceps 4-/4, finger extensors, 2+/3; finger flexors 4+/4+, intrinsic hand muscles 3/3 Hip flexors  3/3 ; Knee extensors 4/4, knee flexors 4-/4-; Ankle extensors 2/2 ankle flexors 4-/4-; toe extensors 2-/2-  Sensory: Sensory testing is intact to pinprick, soft touch and vibration sensation in all 4 extremities.  Coordination: Cerebellar testing reveals good finger-nose-finger and heel-to-shin bilaterally.  Gait and station: She needs support to stand.    Reflexes: Deep tendon reflexes are symmetric and normal bilaterally.     DIAGNOSTIC DATA (LABS, IMAGING, TESTING) - I reviewed patient records, labs, notes, testing and imaging myself where available.  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  Continue Rilutek.   We discussed some natural history of ALS.  It is probable that she will show progressive weakness despite being on the medication.  Fortunately, she does not have any bulbar symptoms though she is having fasciculations involving the right periorbital muscles.  She has been diagnosed with a genetic form of ALS (TARDP).  Of note, her father had bulbar ALS.   She will return to see me in about 5-6 months or sooner if there are new or worsening neurologic  symptoms.  I am glad that she has been seen at the multidisciplinary ALS clinic at [facility].  I did discuss with her that I would be fine if she wanted to  transfer all of her care as they are able to provide additional services beyond medical prescription.  I would also be happy to continue to see her.  43-minute office visit with the majority of the time spent face-to-face for history and physical, discussion/counseling and decision-making.  Additional time with record review and documentation.  Trissa Molina A. Epimenio Foot, MD, [facility] 05/27/2021, 4:55 PM Certified in Neurology, Clinical Neurophysiology, Sleep Medicine and Neuroimaging  [facility] [address], [address] [place], [place] [address] [phone]

## 2021-05-29 DIAGNOSIS — Z9181 History of falling: Secondary | ICD-10-CM | POA: Diagnosis not present

## 2021-05-29 DIAGNOSIS — G1221 Amyotrophic lateral sclerosis: Secondary | ICD-10-CM | POA: Diagnosis not present

## 2021-05-29 DIAGNOSIS — M419 Scoliosis, unspecified: Secondary | ICD-10-CM | POA: Diagnosis not present

## 2021-05-29 DIAGNOSIS — Z993 Dependence on wheelchair: Secondary | ICD-10-CM | POA: Diagnosis not present

## 2021-06-03 ENCOUNTER — Telehealth: Payer: Self-pay | Admitting: Neurology

## 2021-06-03 ENCOUNTER — Telehealth: Payer: Self-pay | Admitting: *Deleted

## 2021-06-03 NOTE — Telephone Encounter (Signed)
Pt called has some questions pertaining to her appt with Dr. Epimenio Foot on last week. Pt requesting a call back.

## 2021-06-03 NOTE — Telephone Encounter (Signed)
Sent email to Radicava rep: [email or web address] to see if pt or our office need to complete anything. Waiting on response.

## 2021-06-03 NOTE — Telephone Encounter (Signed)
-----   Message from Asa Lente, MD sent at 05/27/2021  2:06 PM EST ----- Regarding: Radicava and Rilutek. She is on Radicava and Rilutek for ALS.   She gets Radicava through a foundation --- she wants to make sure all is in order for the new calendar year for these med's and if there are any forms sshe needs to let her know

## 2021-06-03 NOTE — Telephone Encounter (Addendum)
Took call from phone staff and spoke with Alexa Jackson. The only thing from their standpoint for Alexa Jackson is the pt should make sure to notify MD office if she ever has insurance change and provide updated cards. That way our office can forward to Alexa Jackson. If she plans to stay w/ Alexa Jackson, she should be okay.   As far as grant via Alexa Jackson. Pt is responsible for reapplying. She can call them directly to figure out what is needed from their standpoint. Their phone# [phone].   I called pt back. Relayed above info. She verbalized understanding. She will contact [employer] to further inquire about re-applying for grant. She will call back if any further questions arise.

## 2021-06-03 NOTE — Telephone Encounter (Signed)
See other phone note. This has been addressed? 

## 2021-06-05 DIAGNOSIS — Z993 Dependence on wheelchair: Secondary | ICD-10-CM | POA: Diagnosis not present

## 2021-06-05 DIAGNOSIS — Z9181 History of falling: Secondary | ICD-10-CM | POA: Diagnosis not present

## 2021-06-05 DIAGNOSIS — G1221 Amyotrophic lateral sclerosis: Secondary | ICD-10-CM | POA: Diagnosis not present

## 2021-06-05 DIAGNOSIS — M419 Scoliosis, unspecified: Secondary | ICD-10-CM | POA: Diagnosis not present

## 2021-06-10 DIAGNOSIS — Z9181 History of falling: Secondary | ICD-10-CM | POA: Diagnosis not present

## 2021-06-10 DIAGNOSIS — M419 Scoliosis, unspecified: Secondary | ICD-10-CM | POA: Diagnosis not present

## 2021-06-10 DIAGNOSIS — Z993 Dependence on wheelchair: Secondary | ICD-10-CM | POA: Diagnosis not present

## 2021-06-10 DIAGNOSIS — G1221 Amyotrophic lateral sclerosis: Secondary | ICD-10-CM | POA: Diagnosis not present

## 2021-06-17 DIAGNOSIS — M419 Scoliosis, unspecified: Secondary | ICD-10-CM | POA: Diagnosis not present

## 2021-06-17 DIAGNOSIS — Z9181 History of falling: Secondary | ICD-10-CM | POA: Diagnosis not present

## 2021-06-17 DIAGNOSIS — G1221 Amyotrophic lateral sclerosis: Secondary | ICD-10-CM | POA: Diagnosis not present

## 2021-06-17 DIAGNOSIS — Z993 Dependence on wheelchair: Secondary | ICD-10-CM | POA: Diagnosis not present

## 2021-06-24 DIAGNOSIS — Z9181 History of falling: Secondary | ICD-10-CM | POA: Diagnosis not present

## 2021-06-24 DIAGNOSIS — G1221 Amyotrophic lateral sclerosis: Secondary | ICD-10-CM | POA: Diagnosis not present

## 2021-06-24 DIAGNOSIS — M419 Scoliosis, unspecified: Secondary | ICD-10-CM | POA: Diagnosis not present

## 2021-06-24 DIAGNOSIS — Z993 Dependence on wheelchair: Secondary | ICD-10-CM | POA: Diagnosis not present

## 2021-07-08 DIAGNOSIS — Z9181 History of falling: Secondary | ICD-10-CM | POA: Diagnosis not present

## 2021-07-08 DIAGNOSIS — M419 Scoliosis, unspecified: Secondary | ICD-10-CM | POA: Diagnosis not present

## 2021-07-08 DIAGNOSIS — Z993 Dependence on wheelchair: Secondary | ICD-10-CM | POA: Diagnosis not present

## 2021-07-08 DIAGNOSIS — G1221 Amyotrophic lateral sclerosis: Secondary | ICD-10-CM | POA: Diagnosis not present

## 2021-07-29 DIAGNOSIS — Z993 Dependence on wheelchair: Secondary | ICD-10-CM | POA: Diagnosis not present

## 2021-07-29 DIAGNOSIS — Z9181 History of falling: Secondary | ICD-10-CM | POA: Diagnosis not present

## 2021-07-29 DIAGNOSIS — G1221 Amyotrophic lateral sclerosis: Secondary | ICD-10-CM | POA: Diagnosis not present

## 2021-07-29 DIAGNOSIS — M419 Scoliosis, unspecified: Secondary | ICD-10-CM | POA: Diagnosis not present

## 2021-07-31 ENCOUNTER — Telehealth: Payer: Self-pay | Admitting: *Deleted

## 2021-07-31 NOTE — Telephone Encounter (Signed)
Received PA request via fax for Radicava starter kit. Key: [id number]. However, pt is already established on this medication.   I initiated a new case on CMM for maintenance dose. Key: [id number]. Received the following response: "A pending Prior Authorization request for the Patient and Medication exist."  I ended up submitting PA case started for starter kit. Waiting on determination from [facility]. Marked for urgent review.

## 2021-07-31 NOTE — Telephone Encounter (Signed)
PA approved 07/21/2021 - 07/20/2022. Member ID Number: [id number]

## 2021-08-05 DIAGNOSIS — G1221 Amyotrophic lateral sclerosis: Secondary | ICD-10-CM | POA: Diagnosis not present

## 2021-08-05 DIAGNOSIS — Z993 Dependence on wheelchair: Secondary | ICD-10-CM | POA: Diagnosis not present

## 2021-08-05 DIAGNOSIS — M419 Scoliosis, unspecified: Secondary | ICD-10-CM | POA: Diagnosis not present

## 2021-08-05 DIAGNOSIS — Z9181 History of falling: Secondary | ICD-10-CM | POA: Diagnosis not present

## 2021-08-12 DIAGNOSIS — M419 Scoliosis, unspecified: Secondary | ICD-10-CM | POA: Diagnosis not present

## 2021-08-12 DIAGNOSIS — Z993 Dependence on wheelchair: Secondary | ICD-10-CM | POA: Diagnosis not present

## 2021-08-12 DIAGNOSIS — Z9181 History of falling: Secondary | ICD-10-CM | POA: Diagnosis not present

## 2021-08-12 DIAGNOSIS — G1221 Amyotrophic lateral sclerosis: Secondary | ICD-10-CM | POA: Diagnosis not present

## 2021-08-20 DIAGNOSIS — Z9181 History of falling: Secondary | ICD-10-CM | POA: Diagnosis not present

## 2021-08-20 DIAGNOSIS — Z993 Dependence on wheelchair: Secondary | ICD-10-CM | POA: Diagnosis not present

## 2021-08-20 DIAGNOSIS — G1221 Amyotrophic lateral sclerosis: Secondary | ICD-10-CM | POA: Diagnosis not present

## 2021-08-20 DIAGNOSIS — M419 Scoliosis, unspecified: Secondary | ICD-10-CM | POA: Diagnosis not present

## 2021-08-21 DIAGNOSIS — G1221 Amyotrophic lateral sclerosis: Secondary | ICD-10-CM | POA: Diagnosis not present

## 2021-08-21 DIAGNOSIS — Z79899 Other long term (current) drug therapy: Secondary | ICD-10-CM | POA: Diagnosis not present

## 2021-08-26 DIAGNOSIS — Z993 Dependence on wheelchair: Secondary | ICD-10-CM | POA: Diagnosis not present

## 2021-08-26 DIAGNOSIS — Z9181 History of falling: Secondary | ICD-10-CM | POA: Diagnosis not present

## 2021-08-26 DIAGNOSIS — G1221 Amyotrophic lateral sclerosis: Secondary | ICD-10-CM | POA: Diagnosis not present

## 2021-08-26 DIAGNOSIS — M419 Scoliosis, unspecified: Secondary | ICD-10-CM | POA: Diagnosis not present

## 2021-09-02 DIAGNOSIS — M419 Scoliosis, unspecified: Secondary | ICD-10-CM | POA: Diagnosis not present

## 2021-09-02 DIAGNOSIS — Z9181 History of falling: Secondary | ICD-10-CM | POA: Diagnosis not present

## 2021-09-02 DIAGNOSIS — Z993 Dependence on wheelchair: Secondary | ICD-10-CM | POA: Diagnosis not present

## 2021-09-02 DIAGNOSIS — G1221 Amyotrophic lateral sclerosis: Secondary | ICD-10-CM | POA: Diagnosis not present

## 2021-09-09 DIAGNOSIS — Z993 Dependence on wheelchair: Secondary | ICD-10-CM | POA: Diagnosis not present

## 2021-09-09 DIAGNOSIS — Z9181 History of falling: Secondary | ICD-10-CM | POA: Diagnosis not present

## 2021-09-09 DIAGNOSIS — M419 Scoliosis, unspecified: Secondary | ICD-10-CM | POA: Diagnosis not present

## 2021-09-09 DIAGNOSIS — G1221 Amyotrophic lateral sclerosis: Secondary | ICD-10-CM | POA: Diagnosis not present

## 2021-09-13 DIAGNOSIS — G1221 Amyotrophic lateral sclerosis: Secondary | ICD-10-CM | POA: Diagnosis not present

## 2021-09-13 DIAGNOSIS — Z008 Encounter for other general examination: Secondary | ICD-10-CM | POA: Diagnosis not present

## 2021-09-13 DIAGNOSIS — Z7409 Other reduced mobility: Secondary | ICD-10-CM | POA: Diagnosis not present

## 2021-09-13 DIAGNOSIS — R2681 Unsteadiness on feet: Secondary | ICD-10-CM | POA: Diagnosis not present

## 2021-09-16 DIAGNOSIS — Z993 Dependence on wheelchair: Secondary | ICD-10-CM | POA: Diagnosis not present

## 2021-09-16 DIAGNOSIS — Z9181 History of falling: Secondary | ICD-10-CM | POA: Diagnosis not present

## 2021-09-16 DIAGNOSIS — M419 Scoliosis, unspecified: Secondary | ICD-10-CM | POA: Diagnosis not present

## 2021-09-16 DIAGNOSIS — G1221 Amyotrophic lateral sclerosis: Secondary | ICD-10-CM | POA: Diagnosis not present

## 2021-09-23 DIAGNOSIS — Z9181 History of falling: Secondary | ICD-10-CM | POA: Diagnosis not present

## 2021-09-23 DIAGNOSIS — G1221 Amyotrophic lateral sclerosis: Secondary | ICD-10-CM | POA: Diagnosis not present

## 2021-09-23 DIAGNOSIS — M419 Scoliosis, unspecified: Secondary | ICD-10-CM | POA: Diagnosis not present

## 2021-09-23 DIAGNOSIS — Z993 Dependence on wheelchair: Secondary | ICD-10-CM | POA: Diagnosis not present

## 2021-09-27 DIAGNOSIS — Z9181 History of falling: Secondary | ICD-10-CM | POA: Diagnosis not present

## 2021-09-27 DIAGNOSIS — Z993 Dependence on wheelchair: Secondary | ICD-10-CM | POA: Diagnosis not present

## 2021-09-27 DIAGNOSIS — M419 Scoliosis, unspecified: Secondary | ICD-10-CM | POA: Diagnosis not present

## 2021-09-27 DIAGNOSIS — G1221 Amyotrophic lateral sclerosis: Secondary | ICD-10-CM | POA: Diagnosis not present

## 2021-10-03 DIAGNOSIS — G1221 Amyotrophic lateral sclerosis: Secondary | ICD-10-CM | POA: Diagnosis not present

## 2021-10-03 DIAGNOSIS — Z993 Dependence on wheelchair: Secondary | ICD-10-CM | POA: Diagnosis not present

## 2021-10-03 DIAGNOSIS — M419 Scoliosis, unspecified: Secondary | ICD-10-CM | POA: Diagnosis not present

## 2021-10-03 DIAGNOSIS — Z9181 History of falling: Secondary | ICD-10-CM | POA: Diagnosis not present

## 2021-10-07 DIAGNOSIS — Z9181 History of falling: Secondary | ICD-10-CM | POA: Diagnosis not present

## 2021-10-07 DIAGNOSIS — G1221 Amyotrophic lateral sclerosis: Secondary | ICD-10-CM | POA: Diagnosis not present

## 2021-10-07 DIAGNOSIS — M419 Scoliosis, unspecified: Secondary | ICD-10-CM | POA: Diagnosis not present

## 2021-10-07 DIAGNOSIS — Z993 Dependence on wheelchair: Secondary | ICD-10-CM | POA: Diagnosis not present

## 2021-10-14 DIAGNOSIS — M419 Scoliosis, unspecified: Secondary | ICD-10-CM | POA: Diagnosis not present

## 2021-10-14 DIAGNOSIS — G1221 Amyotrophic lateral sclerosis: Secondary | ICD-10-CM | POA: Diagnosis not present

## 2021-10-14 DIAGNOSIS — Z993 Dependence on wheelchair: Secondary | ICD-10-CM | POA: Diagnosis not present

## 2021-10-14 DIAGNOSIS — Z9181 History of falling: Secondary | ICD-10-CM | POA: Diagnosis not present

## 2021-10-16 DIAGNOSIS — G1221 Amyotrophic lateral sclerosis: Secondary | ICD-10-CM | POA: Diagnosis not present

## 2021-10-23 DIAGNOSIS — Z136 Encounter for screening for cardiovascular disorders: Secondary | ICD-10-CM | POA: Diagnosis not present

## 2021-10-23 DIAGNOSIS — Z0000 Encounter for general adult medical examination without abnormal findings: Secondary | ICD-10-CM | POA: Diagnosis not present

## 2021-10-23 DIAGNOSIS — Z131 Encounter for screening for diabetes mellitus: Secondary | ICD-10-CM | POA: Diagnosis not present

## 2021-10-28 DIAGNOSIS — Z136 Encounter for screening for cardiovascular disorders: Secondary | ICD-10-CM | POA: Diagnosis not present

## 2021-10-28 DIAGNOSIS — Z131 Encounter for screening for diabetes mellitus: Secondary | ICD-10-CM | POA: Diagnosis not present

## 2021-10-28 DIAGNOSIS — Z0000 Encounter for general adult medical examination without abnormal findings: Secondary | ICD-10-CM | POA: Diagnosis not present

## 2021-10-28 DIAGNOSIS — Z9181 History of falling: Secondary | ICD-10-CM | POA: Diagnosis not present

## 2021-10-28 DIAGNOSIS — M6250 Muscle wasting and atrophy, not elsewhere classified, unspecified site: Secondary | ICD-10-CM | POA: Diagnosis not present

## 2021-10-28 DIAGNOSIS — G1221 Amyotrophic lateral sclerosis: Secondary | ICD-10-CM | POA: Diagnosis not present

## 2021-11-20 ENCOUNTER — Ambulatory Visit: Payer: Medicare HMO | Admitting: Neurology

## 2021-11-20 DIAGNOSIS — Z7409 Other reduced mobility: Secondary | ICD-10-CM | POA: Diagnosis not present

## 2021-11-20 DIAGNOSIS — R41841 Cognitive communication deficit: Secondary | ICD-10-CM | POA: Diagnosis not present

## 2021-11-20 DIAGNOSIS — G1221 Amyotrophic lateral sclerosis: Secondary | ICD-10-CM | POA: Diagnosis not present

## 2021-11-20 DIAGNOSIS — R531 Weakness: Secondary | ICD-10-CM | POA: Diagnosis not present

## 2021-11-26 DIAGNOSIS — G1221 Amyotrophic lateral sclerosis: Secondary | ICD-10-CM | POA: Diagnosis not present

## 2021-11-26 DIAGNOSIS — Z9181 History of falling: Secondary | ICD-10-CM | POA: Diagnosis not present

## 2021-11-26 DIAGNOSIS — M419 Scoliosis, unspecified: Secondary | ICD-10-CM | POA: Diagnosis not present

## 2021-11-27 ENCOUNTER — Ambulatory Visit: Payer: Medicare HMO | Admitting: Neurology

## 2021-12-19 ENCOUNTER — Other Ambulatory Visit: Payer: Self-pay | Admitting: Neurology

## 2021-12-23 ENCOUNTER — Other Ambulatory Visit: Payer: Self-pay | Admitting: *Deleted

## 2021-12-23 DIAGNOSIS — G1221 Amyotrophic lateral sclerosis: Secondary | ICD-10-CM

## 2021-12-23 MED ORDER — RADICAVA ORS 105 MG/5ML PO SUSP: ORAL | 11 refills | Status: AC

## 2022-01-25 DIAGNOSIS — Z9181 History of falling: Secondary | ICD-10-CM | POA: Diagnosis not present

## 2022-01-25 DIAGNOSIS — M419 Scoliosis, unspecified: Secondary | ICD-10-CM | POA: Diagnosis not present

## 2022-01-25 DIAGNOSIS — Z993 Dependence on wheelchair: Secondary | ICD-10-CM | POA: Diagnosis not present

## 2022-01-25 DIAGNOSIS — G1221 Amyotrophic lateral sclerosis: Secondary | ICD-10-CM | POA: Diagnosis not present

## 2022-03-05 DIAGNOSIS — G1221 Amyotrophic lateral sclerosis: Secondary | ICD-10-CM | POA: Diagnosis not present

## 2022-03-05 DIAGNOSIS — R531 Weakness: Secondary | ICD-10-CM | POA: Diagnosis not present

## 2022-03-05 DIAGNOSIS — Z7409 Other reduced mobility: Secondary | ICD-10-CM | POA: Diagnosis not present

## 2022-03-26 DIAGNOSIS — Z9181 History of falling: Secondary | ICD-10-CM | POA: Diagnosis not present

## 2022-03-26 DIAGNOSIS — M419 Scoliosis, unspecified: Secondary | ICD-10-CM | POA: Diagnosis not present

## 2022-03-26 DIAGNOSIS — G1221 Amyotrophic lateral sclerosis: Secondary | ICD-10-CM | POA: Diagnosis not present

## 2022-03-26 DIAGNOSIS — Z993 Dependence on wheelchair: Secondary | ICD-10-CM | POA: Diagnosis not present

## 2022-06-25 DIAGNOSIS — G5132 Clonic hemifacial spasm, left: Secondary | ICD-10-CM | POA: Diagnosis not present

## 2022-06-25 DIAGNOSIS — R531 Weakness: Secondary | ICD-10-CM | POA: Diagnosis not present

## 2022-06-25 DIAGNOSIS — G1221 Amyotrophic lateral sclerosis: Secondary | ICD-10-CM | POA: Diagnosis not present

## 2022-06-25 DIAGNOSIS — G5131 Clonic hemifacial spasm, right: Secondary | ICD-10-CM | POA: Diagnosis not present

## 2022-06-25 DIAGNOSIS — Z79899 Other long term (current) drug therapy: Secondary | ICD-10-CM | POA: Diagnosis not present

## 2022-07-17 DIAGNOSIS — R030 Elevated blood-pressure reading, without diagnosis of hypertension: Secondary | ICD-10-CM | POA: Diagnosis not present

## 2022-07-17 DIAGNOSIS — G1221 Amyotrophic lateral sclerosis: Secondary | ICD-10-CM | POA: Diagnosis not present

## 2022-07-28 DIAGNOSIS — G1221 Amyotrophic lateral sclerosis: Secondary | ICD-10-CM | POA: Diagnosis not present

## 2022-08-11 DIAGNOSIS — Z993 Dependence on wheelchair: Secondary | ICD-10-CM | POA: Diagnosis not present

## 2022-08-11 DIAGNOSIS — M419 Scoliosis, unspecified: Secondary | ICD-10-CM | POA: Diagnosis not present

## 2022-08-11 DIAGNOSIS — G1221 Amyotrophic lateral sclerosis: Secondary | ICD-10-CM | POA: Diagnosis not present

## 2022-08-11 DIAGNOSIS — Z9181 History of falling: Secondary | ICD-10-CM | POA: Diagnosis not present

## 2022-08-22 DIAGNOSIS — G1221 Amyotrophic lateral sclerosis: Secondary | ICD-10-CM | POA: Diagnosis not present

## 2022-08-22 DIAGNOSIS — Z993 Dependence on wheelchair: Secondary | ICD-10-CM | POA: Diagnosis not present

## 2022-08-22 DIAGNOSIS — Z9181 History of falling: Secondary | ICD-10-CM | POA: Diagnosis not present

## 2022-08-22 DIAGNOSIS — M419 Scoliosis, unspecified: Secondary | ICD-10-CM | POA: Diagnosis not present

## 2022-08-25 DIAGNOSIS — G1221 Amyotrophic lateral sclerosis: Secondary | ICD-10-CM | POA: Diagnosis not present

## 2022-08-27 DIAGNOSIS — G1221 Amyotrophic lateral sclerosis: Secondary | ICD-10-CM | POA: Diagnosis not present

## 2022-09-23 DIAGNOSIS — G1221 Amyotrophic lateral sclerosis: Secondary | ICD-10-CM | POA: Diagnosis not present

## 2022-09-24 DIAGNOSIS — G1221 Amyotrophic lateral sclerosis: Secondary | ICD-10-CM | POA: Diagnosis not present

## 2022-09-26 DIAGNOSIS — G1221 Amyotrophic lateral sclerosis: Secondary | ICD-10-CM | POA: Diagnosis not present

## 2022-10-10 DIAGNOSIS — G1221 Amyotrophic lateral sclerosis: Secondary | ICD-10-CM | POA: Diagnosis not present

## 2022-10-10 DIAGNOSIS — M419 Scoliosis, unspecified: Secondary | ICD-10-CM | POA: Diagnosis not present

## 2022-10-10 DIAGNOSIS — Z9181 History of falling: Secondary | ICD-10-CM | POA: Diagnosis not present

## 2022-10-10 DIAGNOSIS — Z993 Dependence on wheelchair: Secondary | ICD-10-CM | POA: Diagnosis not present

## 2022-10-15 DIAGNOSIS — M419 Scoliosis, unspecified: Secondary | ICD-10-CM | POA: Diagnosis not present

## 2022-10-15 DIAGNOSIS — Z993 Dependence on wheelchair: Secondary | ICD-10-CM | POA: Diagnosis not present

## 2022-10-15 DIAGNOSIS — Z9181 History of falling: Secondary | ICD-10-CM | POA: Diagnosis not present

## 2022-10-15 DIAGNOSIS — G1221 Amyotrophic lateral sclerosis: Secondary | ICD-10-CM | POA: Diagnosis not present

## 2022-10-24 DIAGNOSIS — G1221 Amyotrophic lateral sclerosis: Secondary | ICD-10-CM | POA: Diagnosis not present

## 2022-11-03 ENCOUNTER — Other Ambulatory Visit: Payer: Self-pay | Admitting: Family Medicine

## 2022-11-03 DIAGNOSIS — Z1231 Encounter for screening mammogram for malignant neoplasm of breast: Secondary | ICD-10-CM

## 2022-11-03 DIAGNOSIS — Z993 Dependence on wheelchair: Secondary | ICD-10-CM | POA: Diagnosis not present

## 2022-11-03 DIAGNOSIS — Z1239 Encounter for other screening for malignant neoplasm of breast: Secondary | ICD-10-CM | POA: Diagnosis not present

## 2022-11-03 DIAGNOSIS — G1221 Amyotrophic lateral sclerosis: Secondary | ICD-10-CM | POA: Diagnosis not present

## 2022-11-03 DIAGNOSIS — Z0000 Encounter for general adult medical examination without abnormal findings: Secondary | ICD-10-CM | POA: Diagnosis not present

## 2022-11-23 DIAGNOSIS — G1221 Amyotrophic lateral sclerosis: Secondary | ICD-10-CM | POA: Diagnosis not present

## 2022-11-25 DIAGNOSIS — D8481 Immunodeficiency due to conditions classified elsewhere: Secondary | ICD-10-CM | POA: Diagnosis not present

## 2022-11-25 DIAGNOSIS — I10 Essential (primary) hypertension: Secondary | ICD-10-CM | POA: Diagnosis not present

## 2022-11-25 DIAGNOSIS — D84821 Immunodeficiency due to drugs: Secondary | ICD-10-CM | POA: Diagnosis not present

## 2022-11-25 DIAGNOSIS — G1221 Amyotrophic lateral sclerosis: Secondary | ICD-10-CM | POA: Diagnosis not present

## 2022-12-15 DIAGNOSIS — I10 Essential (primary) hypertension: Secondary | ICD-10-CM | POA: Diagnosis not present

## 2022-12-15 DIAGNOSIS — Z993 Dependence on wheelchair: Secondary | ICD-10-CM | POA: Diagnosis not present

## 2022-12-15 DIAGNOSIS — Z9181 History of falling: Secondary | ICD-10-CM | POA: Diagnosis not present

## 2022-12-15 DIAGNOSIS — G1221 Amyotrophic lateral sclerosis: Secondary | ICD-10-CM | POA: Diagnosis not present

## 2022-12-15 DIAGNOSIS — M419 Scoliosis, unspecified: Secondary | ICD-10-CM | POA: Diagnosis not present

## 2022-12-15 DIAGNOSIS — Z602 Problems related to living alone: Secondary | ICD-10-CM | POA: Diagnosis not present

## 2022-12-17 DIAGNOSIS — Z9181 History of falling: Secondary | ICD-10-CM | POA: Diagnosis not present

## 2022-12-17 DIAGNOSIS — Z993 Dependence on wheelchair: Secondary | ICD-10-CM | POA: Diagnosis not present

## 2022-12-17 DIAGNOSIS — G1221 Amyotrophic lateral sclerosis: Secondary | ICD-10-CM | POA: Diagnosis not present

## 2022-12-17 DIAGNOSIS — M419 Scoliosis, unspecified: Secondary | ICD-10-CM | POA: Diagnosis not present

## 2022-12-22 DIAGNOSIS — G1221 Amyotrophic lateral sclerosis: Secondary | ICD-10-CM | POA: Diagnosis not present

## 2022-12-22 DIAGNOSIS — Z993 Dependence on wheelchair: Secondary | ICD-10-CM | POA: Diagnosis not present

## 2022-12-22 DIAGNOSIS — Z9181 History of falling: Secondary | ICD-10-CM | POA: Diagnosis not present

## 2022-12-22 DIAGNOSIS — M419 Scoliosis, unspecified: Secondary | ICD-10-CM | POA: Diagnosis not present

## 2022-12-24 DIAGNOSIS — G1221 Amyotrophic lateral sclerosis: Secondary | ICD-10-CM | POA: Diagnosis not present

## 2022-12-26 DIAGNOSIS — Z993 Dependence on wheelchair: Secondary | ICD-10-CM | POA: Diagnosis not present

## 2022-12-26 DIAGNOSIS — G1221 Amyotrophic lateral sclerosis: Secondary | ICD-10-CM | POA: Diagnosis not present

## 2022-12-26 DIAGNOSIS — Z9181 History of falling: Secondary | ICD-10-CM | POA: Diagnosis not present

## 2022-12-26 DIAGNOSIS — M419 Scoliosis, unspecified: Secondary | ICD-10-CM | POA: Diagnosis not present

## 2022-12-31 DIAGNOSIS — Z9181 History of falling: Secondary | ICD-10-CM | POA: Diagnosis not present

## 2022-12-31 DIAGNOSIS — M419 Scoliosis, unspecified: Secondary | ICD-10-CM | POA: Diagnosis not present

## 2022-12-31 DIAGNOSIS — G1221 Amyotrophic lateral sclerosis: Secondary | ICD-10-CM | POA: Diagnosis not present

## 2022-12-31 DIAGNOSIS — Z993 Dependence on wheelchair: Secondary | ICD-10-CM | POA: Diagnosis not present

## 2023-01-07 DIAGNOSIS — G1221 Amyotrophic lateral sclerosis: Secondary | ICD-10-CM | POA: Diagnosis not present

## 2023-01-07 DIAGNOSIS — Z9181 History of falling: Secondary | ICD-10-CM | POA: Diagnosis not present

## 2023-01-07 DIAGNOSIS — M419 Scoliosis, unspecified: Secondary | ICD-10-CM | POA: Diagnosis not present

## 2023-01-07 DIAGNOSIS — Z993 Dependence on wheelchair: Secondary | ICD-10-CM | POA: Diagnosis not present

## 2023-01-14 DIAGNOSIS — Z993 Dependence on wheelchair: Secondary | ICD-10-CM | POA: Diagnosis not present

## 2023-01-14 DIAGNOSIS — M419 Scoliosis, unspecified: Secondary | ICD-10-CM | POA: Diagnosis not present

## 2023-01-14 DIAGNOSIS — G1221 Amyotrophic lateral sclerosis: Secondary | ICD-10-CM | POA: Diagnosis not present

## 2023-01-14 DIAGNOSIS — Z9181 History of falling: Secondary | ICD-10-CM | POA: Diagnosis not present

## 2023-01-15 DIAGNOSIS — M419 Scoliosis, unspecified: Secondary | ICD-10-CM | POA: Diagnosis not present

## 2023-01-15 DIAGNOSIS — Z993 Dependence on wheelchair: Secondary | ICD-10-CM | POA: Diagnosis not present

## 2023-01-15 DIAGNOSIS — Z9181 History of falling: Secondary | ICD-10-CM | POA: Diagnosis not present

## 2023-01-15 DIAGNOSIS — G1221 Amyotrophic lateral sclerosis: Secondary | ICD-10-CM | POA: Diagnosis not present

## 2023-01-21 DIAGNOSIS — E638 Other specified nutritional deficiencies: Secondary | ICD-10-CM | POA: Diagnosis not present

## 2023-01-21 DIAGNOSIS — G1221 Amyotrophic lateral sclerosis: Secondary | ICD-10-CM | POA: Diagnosis not present

## 2023-01-21 DIAGNOSIS — Z79899 Other long term (current) drug therapy: Secondary | ICD-10-CM | POA: Diagnosis not present

## 2023-01-21 DIAGNOSIS — Z828 Family history of other disabilities and chronic diseases leading to disablement, not elsewhere classified: Secondary | ICD-10-CM | POA: Diagnosis not present

## 2023-01-21 DIAGNOSIS — Z008 Encounter for other general examination: Secondary | ICD-10-CM | POA: Diagnosis not present

## 2023-01-21 DIAGNOSIS — R638 Other symptoms and signs concerning food and fluid intake: Secondary | ICD-10-CM | POA: Diagnosis not present

## 2023-01-21 DIAGNOSIS — G5131 Clonic hemifacial spasm, right: Secondary | ICD-10-CM | POA: Diagnosis not present

## 2023-01-22 DIAGNOSIS — Z9181 History of falling: Secondary | ICD-10-CM | POA: Diagnosis not present

## 2023-01-22 DIAGNOSIS — G1221 Amyotrophic lateral sclerosis: Secondary | ICD-10-CM | POA: Diagnosis not present

## 2023-01-22 DIAGNOSIS — Z993 Dependence on wheelchair: Secondary | ICD-10-CM | POA: Diagnosis not present

## 2023-01-22 DIAGNOSIS — M419 Scoliosis, unspecified: Secondary | ICD-10-CM | POA: Diagnosis not present

## 2023-01-23 DIAGNOSIS — G1221 Amyotrophic lateral sclerosis: Secondary | ICD-10-CM | POA: Diagnosis not present

## 2023-01-26 DIAGNOSIS — Z9181 History of falling: Secondary | ICD-10-CM | POA: Diagnosis not present

## 2023-01-26 DIAGNOSIS — Z993 Dependence on wheelchair: Secondary | ICD-10-CM | POA: Diagnosis not present

## 2023-01-26 DIAGNOSIS — G1221 Amyotrophic lateral sclerosis: Secondary | ICD-10-CM | POA: Diagnosis not present

## 2023-01-26 DIAGNOSIS — M419 Scoliosis, unspecified: Secondary | ICD-10-CM | POA: Diagnosis not present

## 2023-02-04 DIAGNOSIS — Z993 Dependence on wheelchair: Secondary | ICD-10-CM | POA: Diagnosis not present

## 2023-02-04 DIAGNOSIS — M419 Scoliosis, unspecified: Secondary | ICD-10-CM | POA: Diagnosis not present

## 2023-02-04 DIAGNOSIS — G1221 Amyotrophic lateral sclerosis: Secondary | ICD-10-CM | POA: Diagnosis not present

## 2023-02-04 DIAGNOSIS — Z9181 History of falling: Secondary | ICD-10-CM | POA: Diagnosis not present

## 2023-02-09 DIAGNOSIS — M419 Scoliosis, unspecified: Secondary | ICD-10-CM | POA: Diagnosis not present

## 2023-02-09 DIAGNOSIS — Z993 Dependence on wheelchair: Secondary | ICD-10-CM | POA: Diagnosis not present

## 2023-02-09 DIAGNOSIS — G1221 Amyotrophic lateral sclerosis: Secondary | ICD-10-CM | POA: Diagnosis not present

## 2023-02-09 DIAGNOSIS — Z9181 History of falling: Secondary | ICD-10-CM | POA: Diagnosis not present

## 2023-02-10 DIAGNOSIS — Z9181 History of falling: Secondary | ICD-10-CM | POA: Diagnosis not present

## 2023-02-10 DIAGNOSIS — M419 Scoliosis, unspecified: Secondary | ICD-10-CM | POA: Diagnosis not present

## 2023-02-10 DIAGNOSIS — G1221 Amyotrophic lateral sclerosis: Secondary | ICD-10-CM | POA: Diagnosis not present

## 2023-02-10 DIAGNOSIS — Z993 Dependence on wheelchair: Secondary | ICD-10-CM | POA: Diagnosis not present

## 2023-02-13 DIAGNOSIS — Z993 Dependence on wheelchair: Secondary | ICD-10-CM | POA: Diagnosis not present

## 2023-02-13 DIAGNOSIS — M419 Scoliosis, unspecified: Secondary | ICD-10-CM | POA: Diagnosis not present

## 2023-02-13 DIAGNOSIS — Z602 Problems related to living alone: Secondary | ICD-10-CM | POA: Diagnosis not present

## 2023-02-13 DIAGNOSIS — I10 Essential (primary) hypertension: Secondary | ICD-10-CM | POA: Diagnosis not present

## 2023-02-13 DIAGNOSIS — G1221 Amyotrophic lateral sclerosis: Secondary | ICD-10-CM | POA: Diagnosis not present

## 2023-02-13 DIAGNOSIS — Z9181 History of falling: Secondary | ICD-10-CM | POA: Diagnosis not present

## 2023-02-16 DIAGNOSIS — M419 Scoliosis, unspecified: Secondary | ICD-10-CM | POA: Diagnosis not present

## 2023-02-16 DIAGNOSIS — G1221 Amyotrophic lateral sclerosis: Secondary | ICD-10-CM | POA: Diagnosis not present

## 2023-02-16 DIAGNOSIS — I10 Essential (primary) hypertension: Secondary | ICD-10-CM | POA: Diagnosis not present

## 2023-02-16 DIAGNOSIS — Z993 Dependence on wheelchair: Secondary | ICD-10-CM | POA: Diagnosis not present

## 2023-02-16 DIAGNOSIS — Z9181 History of falling: Secondary | ICD-10-CM | POA: Diagnosis not present

## 2023-02-16 DIAGNOSIS — Z602 Problems related to living alone: Secondary | ICD-10-CM | POA: Diagnosis not present

## 2023-02-17 DIAGNOSIS — Z993 Dependence on wheelchair: Secondary | ICD-10-CM | POA: Diagnosis not present

## 2023-02-17 DIAGNOSIS — Z9181 History of falling: Secondary | ICD-10-CM | POA: Diagnosis not present

## 2023-02-17 DIAGNOSIS — I10 Essential (primary) hypertension: Secondary | ICD-10-CM | POA: Diagnosis not present

## 2023-02-17 DIAGNOSIS — Z602 Problems related to living alone: Secondary | ICD-10-CM | POA: Diagnosis not present

## 2023-02-17 DIAGNOSIS — G1221 Amyotrophic lateral sclerosis: Secondary | ICD-10-CM | POA: Diagnosis not present

## 2023-02-17 DIAGNOSIS — M419 Scoliosis, unspecified: Secondary | ICD-10-CM | POA: Diagnosis not present

## 2023-02-18 DIAGNOSIS — I10 Essential (primary) hypertension: Secondary | ICD-10-CM | POA: Diagnosis not present

## 2023-02-18 DIAGNOSIS — Z9181 History of falling: Secondary | ICD-10-CM | POA: Diagnosis not present

## 2023-02-18 DIAGNOSIS — Z602 Problems related to living alone: Secondary | ICD-10-CM | POA: Diagnosis not present

## 2023-02-18 DIAGNOSIS — G1221 Amyotrophic lateral sclerosis: Secondary | ICD-10-CM | POA: Diagnosis not present

## 2023-02-18 DIAGNOSIS — M419 Scoliosis, unspecified: Secondary | ICD-10-CM | POA: Diagnosis not present

## 2023-02-18 DIAGNOSIS — Z993 Dependence on wheelchair: Secondary | ICD-10-CM | POA: Diagnosis not present

## 2023-02-20 DIAGNOSIS — Z9181 History of falling: Secondary | ICD-10-CM | POA: Diagnosis not present

## 2023-02-20 DIAGNOSIS — G1221 Amyotrophic lateral sclerosis: Secondary | ICD-10-CM | POA: Diagnosis not present

## 2023-02-20 DIAGNOSIS — Z602 Problems related to living alone: Secondary | ICD-10-CM | POA: Diagnosis not present

## 2023-02-20 DIAGNOSIS — I10 Essential (primary) hypertension: Secondary | ICD-10-CM | POA: Diagnosis not present

## 2023-02-20 DIAGNOSIS — M419 Scoliosis, unspecified: Secondary | ICD-10-CM | POA: Diagnosis not present

## 2023-02-20 DIAGNOSIS — Z993 Dependence on wheelchair: Secondary | ICD-10-CM | POA: Diagnosis not present

## 2023-02-23 DIAGNOSIS — Z602 Problems related to living alone: Secondary | ICD-10-CM | POA: Diagnosis not present

## 2023-02-23 DIAGNOSIS — Z993 Dependence on wheelchair: Secondary | ICD-10-CM | POA: Diagnosis not present

## 2023-02-23 DIAGNOSIS — Z9181 History of falling: Secondary | ICD-10-CM | POA: Diagnosis not present

## 2023-02-23 DIAGNOSIS — M419 Scoliosis, unspecified: Secondary | ICD-10-CM | POA: Diagnosis not present

## 2023-02-23 DIAGNOSIS — I10 Essential (primary) hypertension: Secondary | ICD-10-CM | POA: Diagnosis not present

## 2023-02-23 DIAGNOSIS — G1221 Amyotrophic lateral sclerosis: Secondary | ICD-10-CM | POA: Diagnosis not present

## 2023-02-25 DIAGNOSIS — Z993 Dependence on wheelchair: Secondary | ICD-10-CM | POA: Diagnosis not present

## 2023-02-25 DIAGNOSIS — G1221 Amyotrophic lateral sclerosis: Secondary | ICD-10-CM | POA: Diagnosis not present

## 2023-02-25 DIAGNOSIS — I10 Essential (primary) hypertension: Secondary | ICD-10-CM | POA: Diagnosis not present

## 2023-02-25 DIAGNOSIS — M419 Scoliosis, unspecified: Secondary | ICD-10-CM | POA: Diagnosis not present

## 2023-02-25 DIAGNOSIS — Z602 Problems related to living alone: Secondary | ICD-10-CM | POA: Diagnosis not present

## 2023-02-25 DIAGNOSIS — Z9181 History of falling: Secondary | ICD-10-CM | POA: Diagnosis not present

## 2023-02-26 DIAGNOSIS — Z993 Dependence on wheelchair: Secondary | ICD-10-CM | POA: Diagnosis not present

## 2023-02-26 DIAGNOSIS — Z602 Problems related to living alone: Secondary | ICD-10-CM | POA: Diagnosis not present

## 2023-02-26 DIAGNOSIS — G1221 Amyotrophic lateral sclerosis: Secondary | ICD-10-CM | POA: Diagnosis not present

## 2023-02-26 DIAGNOSIS — Z9181 History of falling: Secondary | ICD-10-CM | POA: Diagnosis not present

## 2023-02-26 DIAGNOSIS — M419 Scoliosis, unspecified: Secondary | ICD-10-CM | POA: Diagnosis not present

## 2023-02-26 DIAGNOSIS — I10 Essential (primary) hypertension: Secondary | ICD-10-CM | POA: Diagnosis not present

## 2023-02-27 DIAGNOSIS — M419 Scoliosis, unspecified: Secondary | ICD-10-CM | POA: Diagnosis not present

## 2023-02-27 DIAGNOSIS — I10 Essential (primary) hypertension: Secondary | ICD-10-CM | POA: Diagnosis not present

## 2023-02-27 DIAGNOSIS — Z993 Dependence on wheelchair: Secondary | ICD-10-CM | POA: Diagnosis not present

## 2023-02-27 DIAGNOSIS — G1221 Amyotrophic lateral sclerosis: Secondary | ICD-10-CM | POA: Diagnosis not present

## 2023-02-27 DIAGNOSIS — Z9181 History of falling: Secondary | ICD-10-CM | POA: Diagnosis not present

## 2023-02-27 DIAGNOSIS — Z602 Problems related to living alone: Secondary | ICD-10-CM | POA: Diagnosis not present

## 2023-03-02 DIAGNOSIS — I10 Essential (primary) hypertension: Secondary | ICD-10-CM | POA: Diagnosis not present

## 2023-03-02 DIAGNOSIS — Z9181 History of falling: Secondary | ICD-10-CM | POA: Diagnosis not present

## 2023-03-02 DIAGNOSIS — G1221 Amyotrophic lateral sclerosis: Secondary | ICD-10-CM | POA: Diagnosis not present

## 2023-03-02 DIAGNOSIS — Z993 Dependence on wheelchair: Secondary | ICD-10-CM | POA: Diagnosis not present

## 2023-03-02 DIAGNOSIS — M419 Scoliosis, unspecified: Secondary | ICD-10-CM | POA: Diagnosis not present

## 2023-03-02 DIAGNOSIS — Z602 Problems related to living alone: Secondary | ICD-10-CM | POA: Diagnosis not present

## 2023-03-04 DIAGNOSIS — I10 Essential (primary) hypertension: Secondary | ICD-10-CM | POA: Diagnosis not present

## 2023-03-04 DIAGNOSIS — Z9181 History of falling: Secondary | ICD-10-CM | POA: Diagnosis not present

## 2023-03-04 DIAGNOSIS — Z602 Problems related to living alone: Secondary | ICD-10-CM | POA: Diagnosis not present

## 2023-03-04 DIAGNOSIS — Z993 Dependence on wheelchair: Secondary | ICD-10-CM | POA: Diagnosis not present

## 2023-03-04 DIAGNOSIS — M419 Scoliosis, unspecified: Secondary | ICD-10-CM | POA: Diagnosis not present

## 2023-03-04 DIAGNOSIS — G1221 Amyotrophic lateral sclerosis: Secondary | ICD-10-CM | POA: Diagnosis not present

## 2023-03-06 DIAGNOSIS — M419 Scoliosis, unspecified: Secondary | ICD-10-CM | POA: Diagnosis not present

## 2023-03-06 DIAGNOSIS — G1221 Amyotrophic lateral sclerosis: Secondary | ICD-10-CM | POA: Diagnosis not present

## 2023-03-06 DIAGNOSIS — Z993 Dependence on wheelchair: Secondary | ICD-10-CM | POA: Diagnosis not present

## 2023-03-06 DIAGNOSIS — Z602 Problems related to living alone: Secondary | ICD-10-CM | POA: Diagnosis not present

## 2023-03-06 DIAGNOSIS — Z9181 History of falling: Secondary | ICD-10-CM | POA: Diagnosis not present

## 2023-03-06 DIAGNOSIS — I10 Essential (primary) hypertension: Secondary | ICD-10-CM | POA: Diagnosis not present

## 2023-03-10 DIAGNOSIS — G1221 Amyotrophic lateral sclerosis: Secondary | ICD-10-CM | POA: Diagnosis not present

## 2023-03-10 DIAGNOSIS — Z602 Problems related to living alone: Secondary | ICD-10-CM | POA: Diagnosis not present

## 2023-03-10 DIAGNOSIS — M419 Scoliosis, unspecified: Secondary | ICD-10-CM | POA: Diagnosis not present

## 2023-03-10 DIAGNOSIS — Z9181 History of falling: Secondary | ICD-10-CM | POA: Diagnosis not present

## 2023-03-10 DIAGNOSIS — Z993 Dependence on wheelchair: Secondary | ICD-10-CM | POA: Diagnosis not present

## 2023-03-10 DIAGNOSIS — I10 Essential (primary) hypertension: Secondary | ICD-10-CM | POA: Diagnosis not present

## 2023-03-11 DIAGNOSIS — Z9181 History of falling: Secondary | ICD-10-CM | POA: Diagnosis not present

## 2023-03-11 DIAGNOSIS — Z993 Dependence on wheelchair: Secondary | ICD-10-CM | POA: Diagnosis not present

## 2023-03-11 DIAGNOSIS — G1221 Amyotrophic lateral sclerosis: Secondary | ICD-10-CM | POA: Diagnosis not present

## 2023-03-11 DIAGNOSIS — M419 Scoliosis, unspecified: Secondary | ICD-10-CM | POA: Diagnosis not present

## 2023-03-11 DIAGNOSIS — I10 Essential (primary) hypertension: Secondary | ICD-10-CM | POA: Diagnosis not present

## 2023-03-11 DIAGNOSIS — Z602 Problems related to living alone: Secondary | ICD-10-CM | POA: Diagnosis not present

## 2023-03-18 DIAGNOSIS — M419 Scoliosis, unspecified: Secondary | ICD-10-CM | POA: Diagnosis not present

## 2023-03-18 DIAGNOSIS — Z9181 History of falling: Secondary | ICD-10-CM | POA: Diagnosis not present

## 2023-03-18 DIAGNOSIS — I10 Essential (primary) hypertension: Secondary | ICD-10-CM | POA: Diagnosis not present

## 2023-03-18 DIAGNOSIS — Z993 Dependence on wheelchair: Secondary | ICD-10-CM | POA: Diagnosis not present

## 2023-03-18 DIAGNOSIS — Z602 Problems related to living alone: Secondary | ICD-10-CM | POA: Diagnosis not present

## 2023-03-18 DIAGNOSIS — G1221 Amyotrophic lateral sclerosis: Secondary | ICD-10-CM | POA: Diagnosis not present

## 2023-03-24 DIAGNOSIS — G1221 Amyotrophic lateral sclerosis: Secondary | ICD-10-CM | POA: Diagnosis not present

## 2023-03-24 DIAGNOSIS — Z993 Dependence on wheelchair: Secondary | ICD-10-CM | POA: Diagnosis not present

## 2023-03-24 DIAGNOSIS — Z9181 History of falling: Secondary | ICD-10-CM | POA: Diagnosis not present

## 2023-03-24 DIAGNOSIS — M419 Scoliosis, unspecified: Secondary | ICD-10-CM | POA: Diagnosis not present

## 2023-03-24 DIAGNOSIS — Z602 Problems related to living alone: Secondary | ICD-10-CM | POA: Diagnosis not present

## 2023-03-24 DIAGNOSIS — I10 Essential (primary) hypertension: Secondary | ICD-10-CM | POA: Diagnosis not present

## 2023-03-25 DIAGNOSIS — G1221 Amyotrophic lateral sclerosis: Secondary | ICD-10-CM | POA: Diagnosis not present

## 2023-03-25 DIAGNOSIS — Z9181 History of falling: Secondary | ICD-10-CM | POA: Diagnosis not present

## 2023-03-25 DIAGNOSIS — M419 Scoliosis, unspecified: Secondary | ICD-10-CM | POA: Diagnosis not present

## 2023-03-25 DIAGNOSIS — Z993 Dependence on wheelchair: Secondary | ICD-10-CM | POA: Diagnosis not present

## 2023-03-25 DIAGNOSIS — I10 Essential (primary) hypertension: Secondary | ICD-10-CM | POA: Diagnosis not present

## 2023-03-25 DIAGNOSIS — Z602 Problems related to living alone: Secondary | ICD-10-CM | POA: Diagnosis not present

## 2023-03-26 DIAGNOSIS — G1221 Amyotrophic lateral sclerosis: Secondary | ICD-10-CM | POA: Diagnosis not present

## 2023-03-30 DIAGNOSIS — I10 Essential (primary) hypertension: Secondary | ICD-10-CM | POA: Diagnosis not present

## 2023-03-30 DIAGNOSIS — Z993 Dependence on wheelchair: Secondary | ICD-10-CM | POA: Diagnosis not present

## 2023-03-30 DIAGNOSIS — Z9181 History of falling: Secondary | ICD-10-CM | POA: Diagnosis not present

## 2023-03-30 DIAGNOSIS — M419 Scoliosis, unspecified: Secondary | ICD-10-CM | POA: Diagnosis not present

## 2023-03-30 DIAGNOSIS — G1221 Amyotrophic lateral sclerosis: Secondary | ICD-10-CM | POA: Diagnosis not present

## 2023-03-30 DIAGNOSIS — Z602 Problems related to living alone: Secondary | ICD-10-CM | POA: Diagnosis not present

## 2023-04-01 DIAGNOSIS — G1221 Amyotrophic lateral sclerosis: Secondary | ICD-10-CM | POA: Diagnosis not present

## 2023-04-01 DIAGNOSIS — Z9181 History of falling: Secondary | ICD-10-CM | POA: Diagnosis not present

## 2023-04-01 DIAGNOSIS — M419 Scoliosis, unspecified: Secondary | ICD-10-CM | POA: Diagnosis not present

## 2023-04-01 DIAGNOSIS — I10 Essential (primary) hypertension: Secondary | ICD-10-CM | POA: Diagnosis not present

## 2023-04-01 DIAGNOSIS — Z993 Dependence on wheelchair: Secondary | ICD-10-CM | POA: Diagnosis not present

## 2023-04-01 DIAGNOSIS — Z602 Problems related to living alone: Secondary | ICD-10-CM | POA: Diagnosis not present

## 2023-04-07 DIAGNOSIS — Z602 Problems related to living alone: Secondary | ICD-10-CM | POA: Diagnosis not present

## 2023-04-07 DIAGNOSIS — M419 Scoliosis, unspecified: Secondary | ICD-10-CM | POA: Diagnosis not present

## 2023-04-07 DIAGNOSIS — Z9181 History of falling: Secondary | ICD-10-CM | POA: Diagnosis not present

## 2023-04-07 DIAGNOSIS — I10 Essential (primary) hypertension: Secondary | ICD-10-CM | POA: Diagnosis not present

## 2023-04-07 DIAGNOSIS — G1221 Amyotrophic lateral sclerosis: Secondary | ICD-10-CM | POA: Diagnosis not present

## 2023-04-07 DIAGNOSIS — Z993 Dependence on wheelchair: Secondary | ICD-10-CM | POA: Diagnosis not present

## 2023-04-10 DIAGNOSIS — Z602 Problems related to living alone: Secondary | ICD-10-CM | POA: Diagnosis not present

## 2023-04-10 DIAGNOSIS — Z9181 History of falling: Secondary | ICD-10-CM | POA: Diagnosis not present

## 2023-04-10 DIAGNOSIS — M419 Scoliosis, unspecified: Secondary | ICD-10-CM | POA: Diagnosis not present

## 2023-04-10 DIAGNOSIS — Z993 Dependence on wheelchair: Secondary | ICD-10-CM | POA: Diagnosis not present

## 2023-04-10 DIAGNOSIS — G1221 Amyotrophic lateral sclerosis: Secondary | ICD-10-CM | POA: Diagnosis not present

## 2023-04-10 DIAGNOSIS — I10 Essential (primary) hypertension: Secondary | ICD-10-CM | POA: Diagnosis not present

## 2023-04-25 DIAGNOSIS — G1221 Amyotrophic lateral sclerosis: Secondary | ICD-10-CM | POA: Diagnosis not present

## 2023-05-26 DIAGNOSIS — G1221 Amyotrophic lateral sclerosis: Secondary | ICD-10-CM | POA: Diagnosis not present

## 2023-05-27 DIAGNOSIS — G1221 Amyotrophic lateral sclerosis: Secondary | ICD-10-CM | POA: Diagnosis not present

## 2023-06-25 DIAGNOSIS — G1221 Amyotrophic lateral sclerosis: Secondary | ICD-10-CM | POA: Diagnosis not present

## 2023-07-26 DIAGNOSIS — G1221 Amyotrophic lateral sclerosis: Secondary | ICD-10-CM | POA: Diagnosis not present

## 2023-08-26 DIAGNOSIS — G1221 Amyotrophic lateral sclerosis: Secondary | ICD-10-CM | POA: Diagnosis not present

## 2023-09-23 DIAGNOSIS — G1221 Amyotrophic lateral sclerosis: Secondary | ICD-10-CM | POA: Diagnosis not present

## 2023-10-16 DIAGNOSIS — Z008 Encounter for other general examination: Secondary | ICD-10-CM | POA: Diagnosis not present

## 2023-10-16 DIAGNOSIS — Z9181 History of falling: Secondary | ICD-10-CM | POA: Diagnosis not present

## 2023-10-16 DIAGNOSIS — D84821 Immunodeficiency due to drugs: Secondary | ICD-10-CM | POA: Diagnosis not present

## 2023-10-16 DIAGNOSIS — Z833 Family history of diabetes mellitus: Secondary | ICD-10-CM | POA: Diagnosis not present

## 2023-10-16 DIAGNOSIS — Z8249 Family history of ischemic heart disease and other diseases of the circulatory system: Secondary | ICD-10-CM | POA: Diagnosis not present

## 2023-10-16 DIAGNOSIS — G1221 Amyotrophic lateral sclerosis: Secondary | ICD-10-CM | POA: Diagnosis not present

## 2023-10-16 DIAGNOSIS — R2681 Unsteadiness on feet: Secondary | ICD-10-CM | POA: Diagnosis not present

## 2023-10-16 DIAGNOSIS — Z993 Dependence on wheelchair: Secondary | ICD-10-CM | POA: Diagnosis not present

## 2023-10-16 DIAGNOSIS — K5900 Constipation, unspecified: Secondary | ICD-10-CM | POA: Diagnosis not present

## 2023-10-16 DIAGNOSIS — I10 Essential (primary) hypertension: Secondary | ICD-10-CM | POA: Diagnosis not present

## 2023-11-04 DIAGNOSIS — Z993 Dependence on wheelchair: Secondary | ICD-10-CM | POA: Diagnosis not present

## 2023-11-04 DIAGNOSIS — G1221 Amyotrophic lateral sclerosis: Secondary | ICD-10-CM | POA: Diagnosis not present

## 2023-11-04 DIAGNOSIS — Z0000 Encounter for general adult medical examination without abnormal findings: Secondary | ICD-10-CM | POA: Diagnosis not present

## 2023-11-04 DIAGNOSIS — Z1239 Encounter for other screening for malignant neoplasm of breast: Secondary | ICD-10-CM | POA: Diagnosis not present

## 2023-11-05 ENCOUNTER — Other Ambulatory Visit: Payer: Self-pay | Admitting: Family Medicine

## 2023-11-05 DIAGNOSIS — Z1231 Encounter for screening mammogram for malignant neoplasm of breast: Secondary | ICD-10-CM

## 2023-11-26 DIAGNOSIS — E559 Vitamin D deficiency, unspecified: Secondary | ICD-10-CM | POA: Diagnosis not present

## 2023-11-26 DIAGNOSIS — D539 Nutritional anemia, unspecified: Secondary | ICD-10-CM | POA: Diagnosis not present

## 2023-11-26 DIAGNOSIS — E039 Hypothyroidism, unspecified: Secondary | ICD-10-CM | POA: Diagnosis not present

## 2023-11-26 DIAGNOSIS — E7800 Pure hypercholesterolemia, unspecified: Secondary | ICD-10-CM | POA: Diagnosis not present

## 2023-11-26 DIAGNOSIS — Z1159 Encounter for screening for other viral diseases: Secondary | ICD-10-CM | POA: Diagnosis not present

## 2023-11-26 DIAGNOSIS — Z131 Encounter for screening for diabetes mellitus: Secondary | ICD-10-CM | POA: Diagnosis not present

## 2023-12-10 DIAGNOSIS — G1221 Amyotrophic lateral sclerosis: Secondary | ICD-10-CM | POA: Diagnosis not present

## 2023-12-10 DIAGNOSIS — E785 Hyperlipidemia, unspecified: Secondary | ICD-10-CM | POA: Diagnosis not present

## 2023-12-10 DIAGNOSIS — G5793 Unspecified mononeuropathy of bilateral lower limbs: Secondary | ICD-10-CM | POA: Diagnosis not present

## 2023-12-10 DIAGNOSIS — Z6824 Body mass index (BMI) 24.0-24.9, adult: Secondary | ICD-10-CM | POA: Diagnosis not present

## 2023-12-10 DIAGNOSIS — E559 Vitamin D deficiency, unspecified: Secondary | ICD-10-CM | POA: Diagnosis not present

## 2024-01-27 DIAGNOSIS — G1221 Amyotrophic lateral sclerosis: Secondary | ICD-10-CM | POA: Diagnosis not present

## 2024-01-27 DIAGNOSIS — Z789 Other specified health status: Secondary | ICD-10-CM | POA: Diagnosis not present

## 2024-01-27 DIAGNOSIS — R531 Weakness: Secondary | ICD-10-CM | POA: Diagnosis not present

## 2024-01-27 DIAGNOSIS — Z7409 Other reduced mobility: Secondary | ICD-10-CM | POA: Diagnosis not present

## 2024-03-11 DIAGNOSIS — Z6824 Body mass index (BMI) 24.0-24.9, adult: Secondary | ICD-10-CM | POA: Diagnosis not present

## 2024-03-11 DIAGNOSIS — E7800 Pure hypercholesterolemia, unspecified: Secondary | ICD-10-CM | POA: Diagnosis not present

## 2024-03-11 DIAGNOSIS — M62838 Other muscle spasm: Secondary | ICD-10-CM | POA: Diagnosis not present

## 2024-03-11 DIAGNOSIS — E039 Hypothyroidism, unspecified: Secondary | ICD-10-CM | POA: Diagnosis not present

## 2024-03-11 DIAGNOSIS — E559 Vitamin D deficiency, unspecified: Secondary | ICD-10-CM | POA: Diagnosis not present

## 2024-03-11 DIAGNOSIS — G1221 Amyotrophic lateral sclerosis: Secondary | ICD-10-CM | POA: Diagnosis not present

## 2024-03-11 DIAGNOSIS — Z131 Encounter for screening for diabetes mellitus: Secondary | ICD-10-CM | POA: Diagnosis not present

## 2024-03-11 DIAGNOSIS — Z79899 Other long term (current) drug therapy: Secondary | ICD-10-CM | POA: Diagnosis not present

## 2024-03-29 DIAGNOSIS — E559 Vitamin D deficiency, unspecified: Secondary | ICD-10-CM | POA: Diagnosis not present

## 2024-03-29 DIAGNOSIS — R030 Elevated blood-pressure reading, without diagnosis of hypertension: Secondary | ICD-10-CM | POA: Diagnosis not present

## 2024-03-29 DIAGNOSIS — Z6824 Body mass index (BMI) 24.0-24.9, adult: Secondary | ICD-10-CM | POA: Diagnosis not present

## 2024-03-29 DIAGNOSIS — G1221 Amyotrophic lateral sclerosis: Secondary | ICD-10-CM | POA: Diagnosis not present

## 2024-03-29 DIAGNOSIS — M62838 Other muscle spasm: Secondary | ICD-10-CM | POA: Diagnosis not present

## 2024-03-29 DIAGNOSIS — Z6826 Body mass index (BMI) 26.0-26.9, adult: Secondary | ICD-10-CM | POA: Diagnosis not present

## 2024-05-25 DIAGNOSIS — M7989 Other specified soft tissue disorders: Secondary | ICD-10-CM | POA: Diagnosis not present

## 2024-05-30 DIAGNOSIS — M7989 Other specified soft tissue disorders: Secondary | ICD-10-CM | POA: Diagnosis not present

## 2024-05-30 DIAGNOSIS — M79604 Pain in right leg: Secondary | ICD-10-CM | POA: Diagnosis not present
